# Patient Record
Sex: Male | Born: 1950
Health system: Southern US, Community
[De-identification: ages and names within clinical notes are randomized; demographics above are authoritative.]

## PROBLEM LIST (undated history)

## (undated) DIAGNOSIS — Z5189 Encounter for other specified aftercare: Secondary | ICD-10-CM

## (undated) DIAGNOSIS — N390 Urinary tract infection, site not specified: Secondary | ICD-10-CM

## (undated) DIAGNOSIS — I10 Essential (primary) hypertension: Secondary | ICD-10-CM

## (undated) DIAGNOSIS — M1990 Unspecified osteoarthritis, unspecified site: Secondary | ICD-10-CM

## (undated) DIAGNOSIS — E785 Hyperlipidemia, unspecified: Secondary | ICD-10-CM

## (undated) HISTORY — DX: Unspecified osteoarthritis, unspecified site: M19.90

## (undated) HISTORY — DX: Encounter for other specified aftercare: Z51.89

## (undated) HISTORY — DX: Urinary tract infection, site not specified: N39.0

## (undated) HISTORY — DX: Essential (primary) hypertension: I10

## (undated) HISTORY — PX: OTHER SURGICAL HISTORY: SHX169

## (undated) HISTORY — PX: BUNIONECTOMY: SHX129

## (undated) HISTORY — DX: Hyperlipidemia, unspecified: E78.5

## (undated) HISTORY — PX: COLONOSCOPY: SHX174

---

## 2003-03-03 ENCOUNTER — Encounter: Admission: RE | Admit: 2003-03-03 | Discharge: 2003-03-03 | Payer: Self-pay | Admitting: Family Medicine

## 2006-06-12 DIAGNOSIS — K219 Gastro-esophageal reflux disease without esophagitis: Secondary | ICD-10-CM | POA: Insufficient documentation

## 2007-06-18 ENCOUNTER — Ambulatory Visit: Payer: Self-pay | Admitting: Family Medicine

## 2007-06-19 ENCOUNTER — Encounter: Payer: Self-pay | Admitting: Family Medicine

## 2007-06-19 DIAGNOSIS — E782 Mixed hyperlipidemia: Secondary | ICD-10-CM

## 2007-06-19 LAB — CONVERTED CEMR LAB
Cholesterol: 251 mg/dL — ABNORMAL HIGH (ref 0–200)
LDL Cholesterol: 163 mg/dL — ABNORMAL HIGH (ref 0–99)
Triglycerides: 169 mg/dL — ABNORMAL HIGH (ref <150)
VLDL: 34 mg/dL (ref 0–40)

## 2007-06-25 ENCOUNTER — Telehealth (INDEPENDENT_AMBULATORY_CARE_PROVIDER_SITE_OTHER): Payer: Self-pay | Admitting: *Deleted

## 2009-08-31 ENCOUNTER — Ambulatory Visit: Payer: Self-pay | Admitting: Sports Medicine

## 2009-08-31 DIAGNOSIS — M25559 Pain in unspecified hip: Secondary | ICD-10-CM

## 2009-08-31 DIAGNOSIS — M2020 Hallux rigidus, unspecified foot: Secondary | ICD-10-CM

## 2009-08-31 DIAGNOSIS — M25529 Pain in unspecified elbow: Secondary | ICD-10-CM

## 2010-05-15 NOTE — Assessment & Plan Note (Signed)
Summary: NP,HIP,FINGER/TOE PAIN,[place],MC   Vital Signs:  Patient profile:   60 year old male Height:      70 inches Weight:      170 pounds BMI:     24.48 BP sitting:   132 / 86  Vitals Entered By: Lillia Pauls CMA (Aug 31, 2009 11:24 AM)  History of Present Illness: Left hip hurt 15 yrs ago in fall from horse pretty severe  Rt index finger painful and not straight not dislocated at any time  RT great toe stepped on by horse now will not bend and hurts a good bit  Has bilat medial elbow pain this is worse on left hurts w heavier objects  Allergies (verified): No Known Drug Allergies  Physical Exam  General:  Well-developed,well-nourished,in no acute distress; alert,appropriate and cooperative throughout examination Msk:  both hips are tight but have equal ROM at  ~ 60 deg of rotation very tight on SIJ or x leg tender over hip rotators on left and  troch exc strength  Lt great toe has marked DJD changes only 10 deg ext and 15 deg flex  lt elbow weak on wrist flex slt fing flex some atrophy of med flex compt RT elbow is OK   Impression & Recommendations:  Problem # 1:  HALLUX RIGIDUS (ICD-735.2)  prob DJD  will add sports insoles take pressure off 1st ray  Orders: Sports Insoles ([id number])  Problem # 2:  HIP PAIN, LEFT (ICD-719.45)  His updated medication list for this problem includes:    Diclofenac Sodium 75 Mg Tbec (Diclofenac sodium) .[name]... 1 by mouth bid   tx as soft tissue use NSAIDS see inst  Problem # 3:  ELBOW PAIN, LEFT (ICD-719.42) medial tenniselbow prob f farm work  see rehab use tennis elbow band  reck all in 2 to 3 mos  Complete Medication List: 1)  Diclofenac Sodium 75 Mg Tbec (Diclofenac sodium) .[name].. 1 by mouth bid  Patient Instructions: 1)  wrist curls - down fast / up slow 2)  finger curls - same way 3)  wrist rolls 4)  3 sets 15 reps  5)  for wrist use 3 lbs 6)  for fingers use bands 7)  for toe - try an  insert that takes pressure off inside 8)  left hip  9)  standing hip rotations 10)  set of 10 to 15 11)  xleg stretch 3 x 30 secs 12)  use diclofenac for aching, arthritis sxs as needed up to 2 per day 13)  take with food Prescriptions: DICLOFENAC SODIUM 75 MG TBEC (DICLOFENAC SODIUM) 1 by mouth bid  #60 x 6   Entered and Authorized by:   Enid Baas MD   Signed by:   Enid Baas MD on 08/31/2009   Method used:   Print then Give to Patient   RxID:   [id number]

## 2010-05-17 ENCOUNTER — Encounter: Payer: Self-pay | Admitting: *Deleted

## 2011-01-29 ENCOUNTER — Encounter: Payer: Self-pay | Admitting: *Deleted

## 2011-01-29 NOTE — Patient Instructions (Signed)
APPT WITH DR REGAL ON MON OCT 29TH AT 11AM ARRIVE AT 10:45AM [address] [place] [phone]

## 2013-01-27 ENCOUNTER — Ambulatory Visit: Payer: BC Managed Care – PPO | Attending: Internal Medicine | Admitting: Internal Medicine

## 2013-01-27 ENCOUNTER — Encounter: Payer: Self-pay | Admitting: Internal Medicine

## 2013-01-27 VITALS — BP 150/89 | HR 66 | Temp 98.5°F | Resp 17

## 2013-01-27 DIAGNOSIS — R030 Elevated blood-pressure reading, without diagnosis of hypertension: Secondary | ICD-10-CM | POA: Insufficient documentation

## 2013-01-27 DIAGNOSIS — K219 Gastro-esophageal reflux disease without esophagitis: Secondary | ICD-10-CM

## 2013-01-27 DIAGNOSIS — Z0000 Encounter for general adult medical examination without abnormal findings: Secondary | ICD-10-CM | POA: Insufficient documentation

## 2013-01-27 DIAGNOSIS — Z23 Encounter for immunization: Secondary | ICD-10-CM

## 2013-01-27 DIAGNOSIS — E782 Mixed hyperlipidemia: Secondary | ICD-10-CM

## 2013-01-27 LAB — COMPREHENSIVE METABOLIC PANEL
ALT: 24 U/L (ref 0–53)
Albumin: 4.6 g/dL (ref 3.5–5.2)
CO2: 30 mEq/L (ref 19–32)
Calcium: 9.8 mg/dL (ref 8.4–10.5)
Chloride: 103 mEq/L (ref 96–112)
Glucose, Bld: 76 mg/dL (ref 70–99)
Sodium: 139 mEq/L (ref 135–145)
Total Protein: 7.2 g/dL (ref 6.0–8.3)

## 2013-01-27 LAB — CBC WITH DIFFERENTIAL/PLATELET
Eosinophils Absolute: 0.2 10*3/uL (ref 0.0–0.7)
Hemoglobin: 14.4 g/dL (ref 13.0–17.0)
Lymphocytes Relative: 46 % (ref 12–46)
Lymphs Abs: 2.5 10*3/uL (ref 0.7–4.0)
MCH: 30.6 pg (ref 26.0–34.0)
MCV: 88.5 fL (ref 78.0–100.0)
Monocytes Relative: 12 % (ref 3–12)
Neutrophils Relative %: 38 % — ABNORMAL LOW (ref 43–77)
Platelets: 239 10*3/uL (ref 150–400)
RBC: 4.7 MIL/uL (ref 4.22–5.81)
WBC: 5.3 10*3/uL (ref 4.0–10.5)

## 2013-01-27 LAB — LIPID PANEL
Cholesterol: 263 mg/dL — ABNORMAL HIGH (ref 0–200)
Triglycerides: 374 mg/dL — ABNORMAL HIGH (ref <150)

## 2013-01-27 NOTE — Progress Notes (Signed)
Patient here to establish care and for a physical

## 2013-01-27 NOTE — Progress Notes (Signed)
Patient ID: JAEQUAN PROPES, male   DOB: [birth year], 62 y.o.   MRN: [id number] Patient Demographics  Windle Huebert, is a 62 y.o. male  [id number]  [id number]  DOB - 1951-02-21  Chief Complaint  Patient presents with  . Annual Exam        Subjective:   Rik Wadel today is here to establish primary care. Patient is a 62 year old male with history of hyperlipidemia, not on any medications, presented to establish care. Patient reports that he is doing well with no symptoms at all into skin for physical. His BP was somewhat elevated at 150/89 but states that he is somewhat nervous to be in the clinic. He is otherwise very physically active person, rides mountain bikes. States had colonoscopy and he was 55 and was completely normal, he was recommended next in 10 years  Patient has No headache, No chest pain, No abdominal pain - No Nausea, No new weakness tingling or numbness, No Cough - SOB.   Objective:    Filed Vitals:   01/27/13 1518  BP: 150/89  Pulse: 66  Temp: 98.5 F (36.9 C)  Resp: 17  SpO2: 100%     ALLERGIES:  No Known Allergies  PAST MEDICAL HISTORY: No past medical history on file.  PAST SURGICAL HISTORY: No past surgical history on file.  FAMILY HISTORY: No family history on file.  MEDICATIONS AT HOME: Prior to Admission medications   Medication Sig Start Date End Date Taking? Authorizing Provider  aspirin 81 MG tablet Take 81 mg by mouth daily.   Yes Historical Provider, MD  diclofenac (VOLTAREN) 75 MG EC tablet Take 75 mg by mouth 2 (two) times daily.      Historical Provider, MD    REVIEW OF SYSTEMS:  Constitutional:   No   Fevers, chills, fatigue.  HEENT:    No headaches, Sore throat,   Cardio-vascular: No chest pain,  Orthopnea, swelling in lower extremities, anasarca, palpitations  GI:  No abdominal pain, nausea, vomiting, diarrhea  Resp: No shortness of breath,  No coughing up of blood.No cough.No wheezing.  Skin:  no rash  or lesions.  GU:  no dysuria, change in color of urine, no urgency or frequency.  No flank pain.  Musculoskeletal: No joint pain or swelling.  No decreased range of motion.  No back pain.  Psych: No change in mood or affect. No depression or anxiety.  No memory loss.   Exam  General appearance :Awake, alert, NAD, Speech Clear. HEENT: Atraumatic and Normocephalic, PERLA Neck: supple, no JVD. No cervical lymphadenopathy.  Chest: clear to auscultation bilaterally, no wheezing, rales or rhonchi CVS: S1 S2 regular, no murmurs.  Abdomen: soft, NBS, NT, ND, no gaurding, rigidity or rebound. Extremities: No cyanosis, clubbing, B/L Lower Ext shows no edema,  Neurology: Awake alert, and oriented X 3, CN II-XII intact, Non focal Skin:No Rash or lesions Wounds: N/A    Data Review   Basic Metabolic Panel: No results found for this basename: NA, K, CL, CO2, GLUCOSE, BUN, CREATININE, CALCIUM, MG, PHOS,  in the last 168 hours Liver Function Tests: No results found for this basename: AST, ALT, ALKPHOS, BILITOT, PROT, ALBUMIN,  in the last 168 hours  CBC: No results found for this basename: WBC, NEUTROABS, HGB, HCT, MCV, PLT,  in the last 168 hours ------------------------------------------------------------------------------------------------------------------ No results found for this basename: HGBA1C,  in the last 72 hours ------------------------------------------------------------------------------------------------------------------ No results found for this basename: CHOL, HDL, LDLCALC, TRIG, CHOLHDL, LDLDIRECT,  in the last  72 hours ------------------------------------------------------------------------------------------------------------------ No results found for this basename: TSH, T4TOTAL, FREET3, T3FREE, THYROIDAB,  in the last 72 hours ------------------------------------------------------------------------------------------------------------------ No results found for this  basename: VITAMINB12, FOLATE, FERRITIN, TIBC, IRON, RETICCTPCT,  in the last 72 hours  Coagulation profile  No results found for this basename: INR, PROTIME,  in the last 168 hours    Assessment & Plan   Active Problems: Elevated BP; - We'll recheck it again at the next visit, patient states that he is bit nervous to be in the doctor's office, however if it is high next time, he is open for an antihypertensive.  History of hyperlipidemia: Not on any medications, check lipid panel today  Routine health screening - States he does not need any ophthalmology referral - Labs, CBC, CMET, lipid panel - Colonoscopy was done at age of 2, was normal per patient - Flu shot today  Recommendations: Follow back in 2 weeks for the labs and a repeat BP check  Follow-up in 2 weeks   Semaj Kham M.D. 01/27/2013, 3:29 PM

## 2013-02-02 NOTE — Progress Notes (Signed)
Quick Note:  Please let the patient know that his Chol, Triglycerides, LDL are high. Please call in scripts for Lipitor 20mg  daily at bed time (#30, 3 refills) AND Lopid 600mg  BID (#60, 3 refills). Thanks. He has appt on 10/31. ______

## 2013-02-03 ENCOUNTER — Telehealth: Payer: Self-pay | Admitting: Emergency Medicine

## 2013-02-03 NOTE — Telephone Encounter (Signed)
Message copied by Darlis Loan on Wed Feb 03, 2013  9:47 AM ------      Message from: RAI, RIPUDEEP K      Created: Tue Feb 02, 2013  6:23 PM       Please let the patient know that his Chol, Triglycerides, LDL are high. Please call in scripts for Lipitor 20mg  daily at bed time (#30, 3 refills) AND Lopid 600mg  BID (#60, 3 refills). Thanks. He has appt on 10/31. ------

## 2013-02-03 NOTE — Telephone Encounter (Signed)
Message copied by Darlis Loan on Wed Feb 03, 2013  8:43 AM ------      Message from: RAI, RIPUDEEP K      Created: Tue Feb 02, 2013  6:23 PM       Please let the patient know that his Chol, Triglycerides, LDL are high. Please call in scripts for Lipitor 20mg  daily at bed time (#30, 3 refills) AND Lopid 600mg  BID (#60, 3 refills). Thanks. He has appt on 10/31. ------

## 2013-02-04 ENCOUNTER — Telehealth: Payer: Self-pay | Admitting: Emergency Medicine

## 2013-02-04 MED ORDER — GEMFIBROZIL 600 MG PO TABS: 600.0000 mg | ORAL_TABLET | Freq: Two times a day (BID) | ORAL | Status: DC

## 2013-02-04 MED ORDER — ATORVASTATIN CALCIUM 10 MG PO TABS: 10.0000 mg | ORAL_TABLET | Freq: Every day | ORAL | Status: DC

## 2013-02-04 NOTE — Progress Notes (Signed)
2nd attempt to reach pt. Informed pt scripts were sent to [facility] pharmacy

## 2013-02-12 ENCOUNTER — Ambulatory Visit: Payer: BC Managed Care – PPO | Attending: Internal Medicine | Admitting: Internal Medicine

## 2013-02-12 VITALS — BP 162/92 | HR 70 | Temp 98.2°F | Resp 16 | Wt 177.4 lb

## 2013-02-12 DIAGNOSIS — E782 Mixed hyperlipidemia: Secondary | ICD-10-CM

## 2013-02-12 DIAGNOSIS — E785 Hyperlipidemia, unspecified: Secondary | ICD-10-CM | POA: Insufficient documentation

## 2013-02-12 DIAGNOSIS — I10 Essential (primary) hypertension: Secondary | ICD-10-CM | POA: Insufficient documentation

## 2013-02-12 NOTE — Progress Notes (Signed)
Patient here for follow up on his high cholesterol And triglycerides

## 2013-02-12 NOTE — Progress Notes (Signed)
Patient Demographics  Jeffrey Rose, is a 62 y.o. male  [id number]  [id number]  DOB - 01/15/1951  Chief Complaint  Patient presents with  . Follow-up        Subjective:   Jeffrey Rose today is here for a follow up visit. He was recently seen by Dr. Isidoro Donning and was told to followup today to get his blood pressures checked and also to followup on his cholesterol results. I have advised Jeffrey Rose to go on antihypertensive medications and antilipid medications, however at this time he would like to try diet, and exercise along with lifestyle modifications. He understands the risks of not treating hypertension and dyslipidemia. He is fully aware of these risks and is acceptable to him at this time. He has no other complaints.  Patient has No headache, No chest pain, No abdominal pain - No Nausea, No new weakness tingling or numbness, No Cough - SOB.   Objective:    Filed Vitals:   02/12/13 1603  BP: 162/92  Pulse: 70  Temp: 98.2 F (36.8 C)  Resp: 16  Weight: 177 lb 6.4 oz (80.468 kg)  SpO2: 100%     ALLERGIES:  No Known Allergies  PAST MEDICAL HISTORY: History reviewed. No pertinent past medical history.  MEDICATIONS AT HOME: Prior to Admission medications   Medication Sig Start Date End Date Taking? Authorizing Provider  aspirin 81 MG tablet Take 81 mg by mouth daily.    Historical Provider, MD  atorvastatin (LIPITOR) 10 MG tablet Take 1 tablet (10 mg total) by mouth daily. 02/04/13   Ripudeep Jenna Luo, MD  diclofenac (VOLTAREN) 75 MG EC tablet Take 75 mg by mouth 2 (two) times daily.      Historical Provider, MD  gemfibrozil (LOPID) 600 MG tablet Take 1 tablet (600 mg total) by mouth 2 (two) times daily before a meal. 02/04/13   Ripudeep Jenna Luo, MD     Exam  General appearance :Awake, alert, not in any distress. Speech Clear. Not toxic Looking HEENT: Atraumatic and Normocephalic, pupils equally reactive to light and accomodation Neck: supple, no JVD. No cervical  lymphadenopathy.  Chest:Good air entry bilaterally, no added sounds  CVS: S1 S2 regular, no murmurs.  Abdomen: Bowel sounds present, Non tender and not distended with no gaurding, rigidity or rebound. Extremities: B/L Lower Ext shows no edema, both legs are warm to touch Neurology: Awake alert, and oriented X 3, CN II-XII intact, Non focal Skin:No Rash Wounds:N/A    Data Review   CBC No results found for this basename: WBC, HGB, HCT, PLT, MCV, MCH, MCHC, RDW, NEUTRABS, LYMPHSABS, MONOABS, EOSABS, BASOSABS, BANDABS, BANDSABD,  in the last 168 hours  Chemistries   No results found for this basename: NA, K, CL, CO2, GLUCOSE, BUN, CREATININE, GFRCGP, CALCIUM, MG, AST, ALT, ALKPHOS, BILITOT,  in the last 168 hours ------------------------------------------------------------------------------------------------------------------ No results found for this basename: HGBA1C,  in the last 72 hours ------------------------------------------------------------------------------------------------------------------ No results found for this basename: CHOL, HDL, LDLCALC, TRIG, CHOLHDL, LDLDIRECT,  in the last 72 hours ------------------------------------------------------------------------------------------------------------------ No results found for this basename: TSH, T4TOTAL, FREET3, T3FREE, THYROIDAB,  in the last 72 hours ------------------------------------------------------------------------------------------------------------------ No results found for this basename: VITAMINB12, FOLATE, FERRITIN, TIBC, IRON, RETICCTPCT,  in the last 72 hours  Coagulation profile  No results found for this basename: INR, PROTIME,  in the last 168 hours    Assessment & Plan   Hypertension - Please see above, I have advised patient to go on antihypertensive medications-however at this  time he was to try exercise/diet modification-he understands the risks including target organ damage, cardio vascular  diseases-we will follow him up in the next 3-4 months to see if his blood pressure is still high- he is agreeable to be started on an antihypertensive medication at that point  Dyslipidemia - As above- does not want to get started on antilipid medication- he wants to wait another 3-4 months before starting. He will try diet and exercise  I've asked the patient to make an earlier appointment if he changes his mind.  Followup in 3-4 months    Health Maintenance -Colonoscopy: He claims he had a colonoscopy when he was 52-he would need one the next 2 years -Vaccinations:  -Influenza- received last visit    The patient was given clear instructions to go to ER or return to medical center if symptoms don't improve, worsen or new problems develop. The patient verbalized understanding. The patient was told to call to get lab results if they haven't heard anything in the next week.

## 2016-03-27 ENCOUNTER — Other Ambulatory Visit (INDEPENDENT_AMBULATORY_CARE_PROVIDER_SITE_OTHER): Payer: Medicare Other

## 2016-03-27 ENCOUNTER — Ambulatory Visit (INDEPENDENT_AMBULATORY_CARE_PROVIDER_SITE_OTHER): Payer: Medicare Other | Admitting: Family

## 2016-03-27 ENCOUNTER — Encounter: Payer: Self-pay | Admitting: Family

## 2016-03-27 VITALS — BP 144/82 | HR 66 | Temp 98.4°F | Resp 16 | Ht 70.0 in | Wt 182.8 lb

## 2016-03-27 DIAGNOSIS — I10 Essential (primary) hypertension: Secondary | ICD-10-CM

## 2016-03-27 DIAGNOSIS — Z0000 Encounter for general adult medical examination without abnormal findings: Secondary | ICD-10-CM | POA: Insufficient documentation

## 2016-03-27 DIAGNOSIS — Z23 Encounter for immunization: Secondary | ICD-10-CM | POA: Diagnosis not present

## 2016-03-27 DIAGNOSIS — Z7289 Other problems related to lifestyle: Secondary | ICD-10-CM

## 2016-03-27 DIAGNOSIS — E782 Mixed hyperlipidemia: Secondary | ICD-10-CM

## 2016-03-27 DIAGNOSIS — Z0001 Encounter for general adult medical examination with abnormal findings: Secondary | ICD-10-CM | POA: Insufficient documentation

## 2016-03-27 LAB — LIPID PANEL
CHOL/HDL RATIO: 4
Cholesterol: 265 mg/dL — ABNORMAL HIGH (ref 0–200)
HDL: 61.1 mg/dL (ref >39.00)
LDL CALC: 173 mg/dL — AB (ref 0–99)
NonHDL: 203.63
Triglycerides: 153 mg/dL — ABNORMAL HIGH (ref 0.0–149.0)
VLDL: 30.6 mg/dL (ref 0.0–40.0)

## 2016-03-27 LAB — COMPREHENSIVE METABOLIC PANEL
ALBUMIN: 4.6 g/dL (ref 3.5–5.2)
ALT: 36 U/L (ref 0–53)
AST: 25 U/L (ref 0–37)
Alkaline Phosphatase: 66 U/L (ref 39–117)
BUN: 18 mg/dL (ref 6–23)
CHLORIDE: 104 meq/L (ref 96–112)
CO2: 33 meq/L — AB (ref 19–32)
CREATININE: 0.89 mg/dL (ref 0.40–1.50)
Calcium: 9.7 mg/dL (ref 8.4–10.5)
GFR: 91.18 mL/min (ref >60.00)
Glucose, Bld: 109 mg/dL — ABNORMAL HIGH (ref 70–99)
Potassium: 4.6 mEq/L (ref 3.5–5.1)
SODIUM: 143 meq/L (ref 135–145)
Total Bilirubin: 1.4 mg/dL — ABNORMAL HIGH (ref 0.2–1.2)
Total Protein: 7.8 g/dL (ref 6.0–8.3)

## 2016-03-27 LAB — CBC
HCT: 46.5 % (ref 39.0–52.0)
Hemoglobin: 15.7 g/dL (ref 13.0–17.0)
MCHC: 33.8 g/dL (ref 30.0–36.0)
MCV: 91.5 fl (ref 78.0–100.0)
Platelets: 207 10*3/uL (ref 150.0–400.0)
RBC: 5.08 Mil/uL (ref 4.22–5.81)
RDW: 12.5 % (ref 11.5–15.5)
WBC: 5 10*3/uL (ref 4.0–10.5)

## 2016-03-27 NOTE — Progress Notes (Signed)
Subjective:    Patient ID: Jeffrey Rose, male    DOB: [birth year], 65 y.o.   MRN: [id number]  Chief Complaint  Patient presents with  . Establish Care    HPI:  Jeffrey Rose is a 65 y.o. male who presents today for a Medicare Annual Wellness/Physical exam.    1) Health Maintenance -   Diet - Averages about 3 meals per day consisting of a regular diet. Caffeine intake of about 2-3 cups daily.  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 Hepatitis C Screening  01/21/1951  . HIV Screening  04/03/1966  . TETANUS/TDAP  04/03/1970  . COLONOSCOPY  04/03/2001  . ZOSTAVAX  06/13/2016 (Originally 04/04/2011)  . INFLUENZA VACCINE  Completed    Immunization History  Administered Date(s) Administered  . Influenza Split 01/27/2013  . Tdap 03/27/2016    RISK FACTORS  Tobacco History  Smoking Status  . Former Smoker  Smokeless Tobacco  . Former Systems developer    Comment: smoke pipe, quit 20 years ago     Cardiac risk factors: advanced age (older than 63 for men, 79 for women), dyslipidemia, hypertension and male gender.  Depression Screen  Depression screen [facility] 2/9 03/27/2016  Decreased Interest 0  Down, Depressed, Hopeless 0  PHQ - 2 Score 0     Activities of Daily Living In your present state of health, do you have any difficulty performing the following activities?:  Driving? No Managing money?  No Feeding yourself? No Getting from bed to chair? No Climbing a flight of stairs? No Preparing food and eating?: No Bathing or showering? No Getting dressed: No Getting to the toilet? No Using the toilet: No Moving around from place to place: No In the past year have you fallen or had a near fall?:No   Home Safety Has smoke detector and wears seat belts. No firearms. No excess sun exposure. Are there smokers in your  home (other than you)?  No Do you feel safe at home?  Yes  Hearing Difficulties: No Do you often ask people to speak up or repeat themselves? No Do you experience ringing or noises in your ears? No  Do you have difficulty understanding soft or whispered voices? No    Cognitive Testing  Alert? Yes   Normal Appearance? Yes  Oriented to person? Yes  Place? Yes   Time? Yes  Recall of three objects?  Yes  Can perform simple calculations? Yes  Displays appropriate judgment? Yes  Can read the correct time from a watch face? Yes  Do you feel that you have a problem with memory? No  Do you often misplace items? No   Advanced Directives have been discussed with the patient? Yes   Current Physicians/Providers and Suppliers  1. Terri Piedra, FNP - Internal Medicine 2. Lynne Logan, MD - Urology  Indicate any recent Medical Services you may have received from other than [facility] providers in the past year (date may be approximate).  All answers were reviewed with the patient and necessary referrals were made:  Mauricio Po, [place]   03/27/2016    No Known Allergies   Outpatient Medications Prior to Visit  Medication Sig Dispense Refill  . aspirin 81 MG tablet Take 81 mg by mouth daily.    [name] atorvastatin (LIPITOR) 10 MG tablet Take  1 tablet (10 mg total) by mouth daily. 30 tablet 3  . diclofenac (VOLTAREN) 75 MG EC tablet Take 75 mg by mouth 2 (two) times daily.      [name] gemfibrozil (LOPID) 600 MG tablet Take 1 tablet (600 mg total) by mouth 2 (two) times daily before a meal. 60 tablet 3   No facility-administered medications prior to visit.      Past Medical History:  Diagnosis Date  . Arthritis   . Hyperlipidemia   . Hypertension   . Urinary tract infection      History reviewed. No pertinent surgical history.   Family History  Problem Relation Age of Onset  . Kidney disease Mother   . Diabetes Mother   . Emphysema Father   . Heart disease Father      Social  History   Social History  . Marital status: Married    Spouse name: N/A  . Number of children: 2  . Years of education: 16   Occupational History  . [employer]    Social History Main Topics  . Smoking status: Former Research scientist (life sciences)  . Smokeless tobacco: Former Systems developer     Comment: smoke pipe, quit 20 years ago  . Alcohol use 1.2 - 1.8 oz/week    2 - 3 Glasses of wine per week     Comment: occasionally   . Drug use: No  . Sexual activity: Not on file   Other Topics Concern  . Not on file   Social History Narrative   Fun: [employer] b[employer]     Review of Systems  Constitutional: Denies fever, chills, fatigue, or significant weight gain/loss. HENT: Head: Denies headache or neck pain Ears: Denies changes in hearing, ringing in ears, earache, drainage Nose: Denies discharge, stuffiness, itching, nosebleed, sinus pain Throat: Denies sore throat, hoarseness, dry mouth, sores, thrush Eyes: Denies loss/changes in vision, pain, redness, blurry/double vision, flashing lights Cardiovascular: Denies chest pain/discomfort, tightness, palpitations, shortness of breath with activity, difficulty lying down, swelling, sudden awakening with shortness of breath Respiratory: Denies shortness of breath, cough, sputum production, wheezing Gastrointestinal: Denies dysphasia, heartburn, change in appetite, nausea, change in bowel habits, rectal bleeding, constipation, diarrhea, yellow skin or eyes Genitourinary: Denies frequency, urgency, burning/pain, blood in urine, incontinence, change in urinary strength. Musculoskeletal: Denies muscle/joint pain, stiffness, back pain, redness or swelling of joints, trauma Skin: Denies rashes, lumps, itching, dryness, color changes, or hair/nail changes Neurological: Denies dizziness, fainting, seizures, weakness, numbness, tingling, tremor Psychiatric - Denies nervousness, stress, depression or memory loss Endocrine: Denies heat or cold intolerance, sweating, frequent  urination, excessive thirst, changes in appetite Hematologic: Denies ease of bruising or bleeding    Objective:     BP (!) 144/82 (BP Location: Left Arm, Patient Position: Sitting, Cuff Size: Normal)   Pulse 66   Temp 98.4 F (36.9 C) (Oral)   Resp 16   Ht 5\' 10"  (1.778 m)   Wt 182 lb 12.8 oz (82.9 kg)   SpO2 96%   BMI 26.23 kg/m  Nursing note and vital signs reviewed.   Visual Acuity Screening   Right eye Left eye Both eyes  Without correction: 20/30 20/40 20/30   With correction:       Physical Exam  Constitutional: He is oriented to person, place, and time. He appears well-developed and well-nourished.  HENT:  Head: Normocephalic.  Right Ear: Hearing, tympanic membrane, external ear and ear canal normal.  Left Ear: Hearing, tympanic membrane, external ear and ear canal normal.  Nose:  Nose normal.  Mouth/Throat: Uvula is midline, oropharynx is clear and moist and mucous membranes are normal.  Eyes: Conjunctivae and EOM are normal. Pupils are equal, round, and reactive to light.  Neck: Neck supple. No JVD present. No tracheal deviation present. No thyromegaly present.  Cardiovascular: Normal rate, regular rhythm, normal heart sounds and intact distal pulses.   Pulmonary/Chest: Effort normal and breath sounds normal.  Abdominal: Soft. Bowel sounds are normal. He exhibits no distension and no mass. There is no tenderness. There is no rebound and no guarding.  Musculoskeletal: Normal range of motion. He exhibits no edema or tenderness.  Lymphadenopathy:    He has no cervical adenopathy.  Neurological: He is alert and oriented to person, place, and time. He has normal reflexes. No cranial nerve deficit. He exhibits normal muscle tone. Coordination normal.  Skin: Skin is warm and dry.  Psychiatric: He has a normal mood and affect. His behavior is normal. Judgment and thought content normal.       Assessment & Plan:   During the course of the visit the patient was educated  and counseled about appropriate screening and preventive services including:    Pneumococcal vaccine   Influenza vaccine  Td vaccine  Prostate cancer screening  Colorectal cancer screening  Nutrition counseling   Diet review for nutrition referral? Yes ____  Not Indicated _X___   Patient Instructions (the written plan) was given to the patient.  Medicare Attestation I have personally reviewed: The patient's medical and social history Their use of alcohol, tobacco or illicit drugs Their current medications and supplements The patient's functional ability including ADLs,fall risks, home safety risks, cognitive, and hearing and visual impairment Diet and physical activities Evidence for depression or mood disorders  The patient's weight, height, BMI,  have been recorded in the chart.  I have made referrals, counseling, and provided education to the patient based on review of the above and I have provided the patient with a written personalized care plan for preventive services.     Problem List Items Addressed This Visit      Cardiovascular and Mediastinum   Essential hypertension    Not currently maintained on medication. Blood pressure is slightly elevated. Denies worst headache of life with no symptoms of end organ damaged noted upon exam. Encouraged to monitor blood pressure daily and follow low sodium diet.         Other   MIXED HYPERLIPIDEMIA    Previous lipid profile elevated and not currently maintained on medication. Lipid profile updated today. Recommend decreasing saturated fats and processed/sugary foods. Follow up pending lipid profile results.       Medicare annual wellness visit, initial - Primary    Reviewed and updated patient's medical, surgical, family and social history. Medications and allergies were also reviewed. Basic screenings for depression, activities of daily living, hearing, cognition and safety were performed. Provider list was updated and  health plan was provided to the patient.       Routine general medical examination at a health care facility    1) Anticipatory Guidance: Discussed importance of wearing a seatbelt while driving and not texting while driving; changing batteries in smoke detector at least once annually; wearing suntan lotion when outside; eating a balanced and moderate diet; getting physical activity at least 30 minutes per day.  2) Immunizations / Screenings / Labs:  Tetanus updated today. Hold Shingles pending Shingrix and checking insurance for Zostavax. All other immunizations are up to date per recommendations. Due  for a vision exam encouraged to be completed indepedently. Obtain Hepatitis C antibody for hepatitis C screening. PSA covered by Urology. Due for colonoscopy with referral to GI placed. All other screenings are up to date per recommendations. Obtain CBC, CMET, and lipid profile.    Overall well exam with risk factors for cardiovascular disease including hyperlipidemia. Status updated with Lipid profile today. 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 Continue healthy lifestyle behaviors and choices. Follow up prevention exam in 1 year, and follow up office visit pending blood work and for chronic conditions.      Relevant Orders   CBC (Completed)   Comprehensive metabolic panel (Completed)   Lipid panel (Completed)   Ambulatory referral to Gastroenterology    Other Visit Diagnoses    Other problems related to lifestyle       Relevant Orders   Hepatitis C antibody   Need for Tdap vaccination       Relevant Orders   Tdap vaccine greater than or equal to 7yo IM (Completed)       I have discontinued Mr. Sedgwick's diclofenac, aspirin, atorvastatin, and gemfibrozil.  Follow-up: Return in about 3 months (around 06/25/2016), or if symptoms worsen or fail to improve.   Mauricio Po, FNP

## 2016-03-27 NOTE — Assessment & Plan Note (Signed)
Not currently maintained on medication. Blood pressure is slightly elevated. Denies worst headache of life with no symptoms of end organ damaged noted upon exam. Encouraged to monitor blood pressure daily and follow low sodium diet.

## 2016-03-27 NOTE — Assessment & Plan Note (Signed)
Reviewed and updated patient's medical, surgical, family and social history. Medications and allergies were also reviewed. Basic screenings for depression, activities of daily living, hearing, cognition and safety were performed. Provider list was updated and health plan was provided to the patient.  

## 2016-03-27 NOTE — Patient Instructions (Addendum)
Thank you for choosing [facility].  SUMMARY AND INSTRUCTIONS:  Dr. Pricilla Holm.   Labs:  Please stop by the lab on the lower level of the building for your blood work. Your results will be released to [place] (or called to you) after review, usually within 72 hours after test completion. If any changes need to be made, you will be notified at that same time.  1.) The lab is open from 7:30am to 5:30 pm Monday-Friday 2.) No appointment is necessary 3.) Fasting (if needed) is 6-8 hours after food and drink; black coffee and water are okay    Referrals:  They will call to schedule your appointment with gastroenterology for your colonoscopy.   Referrals have been made during this visit. You should expect to hear back from our schedulers in about 7-10 days in regards to establishing an appointment with the specialists we discussed.   Follow up:  If your symptoms worsen or fail to improve, please contact our office for further instruction, or in case of emergency go directly to the emergency room at the closest medical facility.    Health Maintenance  Topic Date Due  . Hepatitis C Screening  12-01-1950  . HIV Screening  04/03/1966  . TETANUS/TDAP  04/03/1970  . COLONOSCOPY  04/03/2001  . ZOSTAVAX  06/13/2016 (Originally 04/04/2011)  . INFLUENZA VACCINE  Completed   Health Maintenance, Male A healthy lifestyle and preventative care can promote health and wellness.  Maintain regular health, dental, and eye exams.  Eat a healthy diet. Foods like vegetables, fruits, whole grains, low-fat dairy products, and lean protein foods contain the nutrients you need and are low in calories. Decrease your intake of foods high in solid fats, added sugars, and salt. Get information about a proper diet from your health care provider, if necessary.  Regular physical exercise is one of the most important things you can do for your health. Most adults should get at least 150 minutes of  moderate-intensity exercise (any activity that increases your heart rate and causes you to sweat) each week. In addition, most adults need muscle-strengthening exercises on 2 or more days a week.   Maintain a healthy weight. The body mass index (BMI) is a screening tool to identify possible weight problems. It provides an estimate of body fat based on height and weight. Your health care provider can find your BMI and can help you achieve or maintain a healthy weight. For males 20 years and older:  A BMI below 18.5 is considered underweight.  A BMI of 18.5 to 24.9 is normal.  A BMI of 25 to 29.9 is considered overweight.  A BMI of 30 and above is considered obese.  Maintain normal blood lipids and cholesterol by exercising and minimizing your intake of saturated fat. Eat a balanced diet with plenty of fruits and vegetables. Blood tests for lipids and cholesterol should begin at age 54 and be repeated every 5 years. If your lipid or cholesterol levels are high, you are over age 104, or you are at high risk for heart disease, you may need your cholesterol levels checked more frequently.Ongoing high lipid and cholesterol levels should be treated with medicines if diet and exercise are not working.  If you smoke, find out from your health care provider how to quit. If you do not use tobacco, do not start.  Lung cancer screening is recommended for adults aged 53-80 years who are at high risk for developing lung cancer because of a history  of smoking. A yearly low-dose CT scan of the lungs is recommended for people who have at least a 30-pack-year history of smoking and are current smokers or have quit within the past 15 years. A pack year of smoking is smoking an average of 1 pack of cigarettes a day for 1 year (for example, a 30-pack-year history of smoking could mean smoking 1 pack a day for 30 years or 2 packs a day for 15 years). Yearly screening should continue until the smoker has stopped smoking  for at least 15 years. Yearly screening should be stopped for people who develop a health problem that would prevent them from having lung cancer treatment.  If you choose to drink alcohol, do not have more than 2 drinks per day. One drink is considered to be 12 oz (360 mL) of beer, 5 oz (150 mL) of wine, or 1.5 oz (45 mL) of liquor.  Avoid the use of street drugs. Do not share needles with anyone. Ask for help if you need support or instructions about stopping the use of drugs.  High blood pressure causes heart disease and increases the risk of stroke. High blood pressure is more likely to develop in:  People who have blood pressure in the end of the normal range (100-139/85-89 mm Hg).  People who are overweight or obese.  People who are African American.  If you are 70-49 years of age, have your blood pressure checked every 3-5 years. If you are 54 years of age or older, have your blood pressure checked every year. You should have your blood pressure measured twice-once when you are at a hospital or clinic, and once when you are not at a hospital or clinic. Record the average of the two measurements. To check your blood pressure when you are not at a hospital or clinic, you can use:  An automated blood pressure machine at a pharmacy.  A home blood pressure monitor.  If you are 41-38 years old, ask your health care provider if you should take aspirin to prevent heart disease.  Diabetes screening involves taking a blood sample to check your fasting blood sugar level. This should be done once every 3 years after age 68 if you are at a normal weight and without risk factors for diabetes. Testing should be considered at a younger age or be carried out more frequently if you are overweight and have at least 1 risk factor for diabetes.  Colorectal cancer can be detected and often prevented. Most routine colorectal cancer screening begins at the age of 63 and continues through age 50. However, your  health care provider may recommend screening at an earlier age if you have risk factors for colon cancer. On a yearly basis, your health care provider may provide home test kits to check for hidden blood in the stool. A small camera at the end of a tube may be used to directly examine the colon (sigmoidoscopy or colonoscopy) to detect the earliest forms of colorectal cancer. Talk to your health care provider about this at age 9 when routine screening begins. A direct exam of the colon should be repeated every 5-10 years through age 56, unless early forms of precancerous polyps or small growths are found.  People who are at an increased risk for hepatitis B should be screened for this virus. You are considered at high risk for hepatitis B if:  You were born in a country where hepatitis B occurs often. Talk with your health  care provider about which countries are considered high risk.  Your parents were born in a high-risk country and you have not received a shot to protect against hepatitis B (hepatitis B vaccine).  You have HIV or AIDS.  You use needles to inject street drugs.  You live with, or have sex with, someone who has hepatitis B.  You are a man who has sex with other men (MSM).  You get hemodialysis treatment.  You take certain medicines for conditions like cancer, organ transplantation, and autoimmune conditions.  Hepatitis C blood testing is recommended for all people born from [birth year] through 1965 and any individual with known risk factors for hepatitis C.  Healthy men should no longer receive prostate-specific antigen (PSA) blood tests as part of routine cancer screening. Talk to your health care provider about prostate cancer screening.  Testicular cancer screening is not recommended for adolescents or adult males who have no symptoms. Screening includes self-exam, a health care provider exam, and other screening tests. Consult with your health care provider about any symptoms you  have or any concerns you have about testicular cancer.  Practice safe sex. Use condoms and avoid high-risk sexual practices to reduce the spread of sexually transmitted infections (STIs).  You should be screened for STIs, including gonorrhea and chlamydia if:  You are sexually active and are younger than 24 years.  You are older than 24 years, and your health care provider tells you that you are at risk for this type of infection.  Your sexual activity has changed since you were last screened, and you are at an increased risk for chlamydia or gonorrhea. Ask your health care provider if you are at risk.  If you are at risk of being infected with HIV, it is recommended that you take a prescription medicine daily to prevent HIV infection. This is called pre-exposure prophylaxis (PrEP). You are considered at risk if:  You are a man who has sex with other men (MSM).  You are a heterosexual man who is sexually active with multiple partners.  You take drugs by injection.  You are sexually active with a partner who has HIV.  Talk with your health care provider about whether you are at high risk of being infected with HIV. If you choose to begin PrEP, you should first be tested for HIV. You should then be tested every 3 months for as long as you are taking PrEP.  Use sunscreen. Apply sunscreen liberally and repeatedly throughout the day. You should seek shade when your shadow is shorter than you. Protect yourself by wearing long sleeves, pants, a wide-brimmed hat, and sunglasses year round whenever you are outdoors.  Tell your health care provider of new moles or changes in moles, especially if there is a change in shape or color. Also, tell your health care provider if a mole is larger than the size of a pencil eraser.  A one-time screening for abdominal aortic aneurysm (AAA) and surgical repair of large AAAs by ultrasound is recommended for men aged 77-75 years who are current or former  smokers.  Stay current with your vaccines (immunizations). This information is not intended to replace advice given to you by your health care provider. Make sure you discuss any questions you have with your health care provider. Document Released: 09/28/2007 Document Revised: 04/22/2014 Document Reviewed: 01/03/2015 Elsevier Interactive Patient Education  2017 [employer].

## 2016-03-27 NOTE — Assessment & Plan Note (Signed)
1) Anticipatory Guidance: Discussed importance of wearing a seatbelt while driving and not texting while driving; changing batteries in smoke detector at least once annually; wearing suntan lotion when outside; eating a balanced and moderate diet; getting physical activity at least 30 minutes per day.  2) Immunizations / Screenings / Labs:  Tetanus updated today. Hold Shingles pending Shingrix and checking insurance for Zostavax. All other immunizations are up to date per recommendations. Due for a vision exam encouraged to be completed indepedently. Obtain Hepatitis C antibody for hepatitis C screening. PSA covered by Urology. Due for colonoscopy with referral to GI placed. All other screenings are up to date per recommendations. Obtain CBC, CMET, and lipid profile.    Overall well exam with risk factors for cardiovascular disease including hyperlipidemia. Status updated with Lipid profile today. 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 Continue healthy lifestyle behaviors and choices. Follow up prevention exam in 1 year, and follow up office visit pending blood work and for chronic conditions.

## 2016-03-27 NOTE — Assessment & Plan Note (Signed)
Previous lipid profile elevated and not currently maintained on medication. Lipid profile updated today. Recommend decreasing saturated fats and processed/sugary foods. Follow up pending lipid profile results.

## 2016-03-28 ENCOUNTER — Encounter: Payer: Self-pay | Admitting: Family

## 2016-03-28 LAB — HEPATITIS C ANTIBODY: HCV AB: NEGATIVE

## 2016-04-01 MED ORDER — PRAVASTATIN SODIUM 20 MG PO TABS: 20.0000 mg | ORAL_TABLET | Freq: Every day | ORAL | 2 refills | Status: DC

## 2016-04-05 DIAGNOSIS — Z23 Encounter for immunization: Secondary | ICD-10-CM | POA: Diagnosis not present

## 2016-04-16 ENCOUNTER — Telehealth: Payer: Self-pay | Admitting: Family

## 2016-04-16 DIAGNOSIS — Z1211 Encounter for screening for malignant neoplasm of colon: Secondary | ICD-10-CM

## 2016-04-16 NOTE — Telephone Encounter (Signed)
Order placed

## 2016-04-16 NOTE — Telephone Encounter (Signed)
Patient is requesting routine colonoscopy referral.

## 2016-04-22 ENCOUNTER — Encounter: Payer: Self-pay | Admitting: Family

## 2016-04-24 ENCOUNTER — Encounter: Payer: Self-pay | Admitting: Gastroenterology

## 2016-05-06 ENCOUNTER — Ambulatory Visit (INDEPENDENT_AMBULATORY_CARE_PROVIDER_SITE_OTHER): Payer: Medicare Other

## 2016-05-06 ENCOUNTER — Ambulatory Visit (INDEPENDENT_AMBULATORY_CARE_PROVIDER_SITE_OTHER): Payer: Medicare Other | Admitting: Podiatry

## 2016-05-06 ENCOUNTER — Encounter: Payer: Self-pay | Admitting: Podiatry

## 2016-05-06 VITALS — HR 71 | Resp 16 | Ht 70.0 in | Wt 180.0 lb

## 2016-05-06 DIAGNOSIS — M2022 Hallux rigidus, left foot: Secondary | ICD-10-CM

## 2016-05-06 DIAGNOSIS — M21611 Bunion of right foot: Secondary | ICD-10-CM

## 2016-05-06 DIAGNOSIS — M21612 Bunion of left foot: Secondary | ICD-10-CM | POA: Diagnosis not present

## 2016-05-06 DIAGNOSIS — L600 Ingrowing nail: Secondary | ICD-10-CM

## 2016-05-06 NOTE — Patient Instructions (Signed)
Pre-Operative Instructions  Congratulations, you have decided to take an important step to improving your quality of life.  You can be assured that the doctors of [facility] will be with you every step of the way.  1. Plan to be at the surgery center/hospital at least 1 (one) hour prior to your scheduled time unless otherwise directed by the surgical center/hospital staff.  You must have a responsible adult accompany you, remain during the surgery and drive you home.  Make sure you have directions to the surgical center/hospital and know how to get there on time. 2. For hospital based surgery you will need to obtain a history and physical form from your family physician within 1 month prior to the date of surgery- we will give you a form for you primary physician.  3. We make every effort to accommodate the date you request for surgery.  There are however, times where surgery dates or times have to be moved.  We will contact you as soon as possible if a change in schedule is required.   4. No Aspirin/Ibuprofen for one week before surgery.  If you are on aspirin, any non-steroidal anti-inflammatory medications (Mobic, Aleve, Ibuprofen) you should stop taking it 7 days prior to your surgery.  You make take Tylenol  For pain prior to surgery.  5. Medications- If you are taking daily heart and blood pressure medications, seizure, reflux, allergy, asthma, anxiety, pain or diabetes medications, make sure the surgery center/hospital is aware before the day of surgery so they may notify you which medications to take or avoid the day of surgery. 6. No food or drink after midnight the night before surgery unless directed otherwise by surgical center/hospital staff. 7. No alcoholic beverages 24 hours prior to surgery.  No smoking 24 hours prior to or 24 hours after surgery. 8. Wear loose pants or shorts- loose enough to fit over bandages, boots, and casts. 9. No slip on shoes, sneakers are best. 10. Bring  your boot with you to the surgery center/hospital.  Also bring crutches or a walker if your physician has prescribed it for you.  If you do not have this equipment, it will be provided for you after surgery. 11. If you have not been contracted by the surgery center/hospital by the day before your surgery, call to confirm the date and time of your surgery. 12. Leave-time from work may vary depending on the type of surgery you have.  Appropriate arrangements should be made prior to surgery with your employer. 13. Prescriptions will be provided immediately following surgery by your doctor.  Have these filled as soon as possible after surgery and take the medication as directed. 14. Remove nail polish on the operative foot. 15. Wash the night before surgery.  The night before surgery wash the foot and leg well with the antibacterial soap provided and water paying special attention to beneath the toenails and in between the toes.  Rinse thoroughly with water and dry well with a towel.  Perform this wash unless told not to do so by your physician.  Enclosed: 1 Ice pack (please put in freezer the night before surgery)   1 Hibiclens skin cleaner   Pre-op Instructions  If you have any questions regarding the instructions, do not hesitate to call our office.  [place]: [address] [place], [place] [address] [phone]  [place]: [address] [place], [place] [address] [phone]  [place]: [address]  [place], [place] [address] [phone]   Dr.   Shandon Burlingame DPM, Dr. Matthew Wagoner DPM, Dr. M. Todd Hyatt DPM, Dr. Titorya Stover DPM 

## 2016-05-07 NOTE — Progress Notes (Signed)
Subjective:     Patient ID: Jeffrey Rose, male   DOB: November 02, 1950, 66 y.o.   MRN: [id number]  HPI patient states that he knows he needs to get the big toe joint fixed on his left foot like his right one and it started to become increasingly painful over the last 6 months and he's been trying wider shoes and other treatment options. Also states his big toenail right lateral sides been sore as the second toe presses on   Review of Systems  All other systems reviewed and are negative.      Objective:   Physical Exam  Constitutional: He is oriented to person, place, and time.  Cardiovascular: Intact distal pulses.   Musculoskeletal: Normal range of motion.  Neurological: He is oriented to person, place, and time.  Skin: Skin is warm.  Nursing note and vitals reviewed.  neurovascular status intact muscle strength adequate range of motion within normal limits with patient noted to have exquisite discomfort first MPJ left with narrowing the joint surface spur formation and reduced range of motion of the joint. Patient also has good digital perfusion is well oriented and the right second toe doesn't lean against the big toe with incurvation of the lateral border. Previous surgery on the right big toe joint that's done well     Assessment:     Hallux limitus rigidus condition left with spur formation pain of the joint surface and probable ingrown toenail right with structural changes of the big toe joint and second toe    Plan:     H&P x-rays reviewed conditions discussed at great length. Due to previous comfort with surgery patient wants surgery and wants to do consent form today so he does not have another copayment. I reviewed Austin-type osteotomy left going over alternative treatments and complications that can occur and the fact that ultimately could require joint implantation or fusion procedure. Patient wants surgery signed consent form is given all preoperative instructions and did have  air fracture walker dispensed with all instructions on usage. For the right hallux nail I do not recommend correction but we may do it in the future depending on how it does  X-ray indicates there is large spurring around the first metatarsal head left with narrowing of the joint surface and elevation of the first metatarsal segment

## 2016-05-08 ENCOUNTER — Telehealth: Payer: Self-pay | Admitting: *Deleted

## 2016-05-08 NOTE — Telephone Encounter (Signed)
Entered in error

## 2016-05-09 ENCOUNTER — Telehealth: Payer: Self-pay | Admitting: *Deleted

## 2016-05-09 NOTE — Telephone Encounter (Signed)
"  The request from Dr. Paulla Dolly for cpt [phone] has been authorized.  It is authorized from 05/08/2016 to 06/07/2016.  The authorization number is [id number].

## 2016-05-14 ENCOUNTER — Encounter: Payer: Self-pay | Admitting: Podiatry

## 2016-05-14 DIAGNOSIS — M2012 Hallux valgus (acquired), left foot: Secondary | ICD-10-CM | POA: Diagnosis not present

## 2016-05-14 DIAGNOSIS — K219 Gastro-esophageal reflux disease without esophagitis: Secondary | ICD-10-CM | POA: Diagnosis not present

## 2016-05-14 DIAGNOSIS — M2022 Hallux rigidus, left foot: Secondary | ICD-10-CM | POA: Diagnosis not present

## 2016-05-17 ENCOUNTER — Telehealth: Payer: Self-pay

## 2016-05-17 NOTE — Telephone Encounter (Signed)
LVM regarding post op status 

## 2016-05-21 NOTE — Progress Notes (Signed)
DOS 01.30.2018 Bi-Planar Austin (cutting and moving bone) with Pin Fixation Left

## 2016-05-22 ENCOUNTER — Ambulatory Visit (INDEPENDENT_AMBULATORY_CARE_PROVIDER_SITE_OTHER): Payer: Medicare Other

## 2016-05-22 ENCOUNTER — Ambulatory Visit (INDEPENDENT_AMBULATORY_CARE_PROVIDER_SITE_OTHER): Payer: Self-pay | Admitting: Podiatry

## 2016-05-22 ENCOUNTER — Encounter: Payer: Self-pay | Admitting: Podiatry

## 2016-05-22 VITALS — BP 144/91 | HR 89 | Temp 99.1°F

## 2016-05-22 DIAGNOSIS — M21612 Bunion of left foot: Secondary | ICD-10-CM

## 2016-05-22 DIAGNOSIS — M2022 Hallux rigidus, left foot: Secondary | ICD-10-CM | POA: Diagnosis not present

## 2016-05-22 DIAGNOSIS — M21611 Bunion of right foot: Secondary | ICD-10-CM

## 2016-05-22 DIAGNOSIS — Z9889 Other specified postprocedural states: Secondary | ICD-10-CM

## 2016-05-29 ENCOUNTER — Ambulatory Visit (INDEPENDENT_AMBULATORY_CARE_PROVIDER_SITE_OTHER): Payer: Medicare Other | Admitting: Podiatry

## 2016-05-29 DIAGNOSIS — M21611 Bunion of right foot: Secondary | ICD-10-CM | POA: Diagnosis not present

## 2016-05-29 DIAGNOSIS — M21612 Bunion of left foot: Secondary | ICD-10-CM | POA: Diagnosis not present

## 2016-05-29 DIAGNOSIS — M2022 Hallux rigidus, left foot: Secondary | ICD-10-CM

## 2016-05-29 NOTE — Progress Notes (Signed)
Subjective: Patient presents today status post bunionectomy surgery and hallux limitus correction left foot. Date of surgery 05/14/2016. Surgery performed by Dr. Paulla Dolly. Patient is using Advil for pain as needed.  Objective: Skin incisions are well coapted with sutures intact. No sign of infectious process noted. Moderate edema noted. Negative for drainage.  Assessment: Status post hallux limitus correction left foot. Date of surgery 05/14/2016.  Plan of care: Today dressings were changed. Patient can begin using his Darco shoe at home. Follow-up in 1 week with Dr. Paulla Dolly. Continue Advil as needed for pain.  Edrick Kins, DPM [facility]  Dr. Edrick Kins, [place]                                        [place], [place] [address]                Office [phone]  Fax [phone]

## 2016-05-29 NOTE — Progress Notes (Signed)
Subjective:     Patient ID: Jeffrey Rose, male   DOB: 1950-11-04, 66 y.o.   MRN: [id number]  HPI patient states she's doing well with minimal discomfort of the joint first MPJ left   Review of Systems     Objective:   Physical Exam Neurovascular status intact negative Homans sign was noted with good motion of the first MPJ left with no crepitus of the joint surface    Assessment:     Doing well post osteotomy first metatarsal left with encouragement a range of motion needs to be given to patient    Plan:     At this point recommended range of motion exercises left dispensed surgical shoe compression stocking and did do a bump ankle BioSkin brace to plantarflex the first metatarsal and instructed on stretching of this and the nonweightbearing status. Continue immobilization reappoint 4 weeks or earlier if needed

## 2016-06-13 ENCOUNTER — Ambulatory Visit (AMBULATORY_SURGERY_CENTER): Payer: Self-pay

## 2016-06-13 VITALS — Ht 70.0 in | Wt 186.0 lb

## 2016-06-13 DIAGNOSIS — Z1211 Encounter for screening for malignant neoplasm of colon: Secondary | ICD-10-CM

## 2016-06-13 MED ORDER — SUPREP BOWEL PREP KIT 17.5-3.13-1.6 GM/177ML PO SOLN: 1.0000 | Freq: Once | ORAL | 0 refills | Status: AC

## 2016-06-13 NOTE — Progress Notes (Signed)
Patient denies allergies to eggs or soy. Patient is not on home 02. Patient is not taking diet pills.  Patient denies problems with anesthesia.   Patient declines Emmi.

## 2016-06-14 ENCOUNTER — Encounter: Payer: Self-pay | Admitting: Gastroenterology

## 2016-06-27 ENCOUNTER — Encounter: Payer: Self-pay | Admitting: Gastroenterology

## 2016-06-27 ENCOUNTER — Ambulatory Visit (AMBULATORY_SURGERY_CENTER): Payer: Medicare Other | Admitting: Gastroenterology

## 2016-06-27 VITALS — BP 123/77 | HR 60 | Temp 96.9°F | Resp 12 | Ht 70.0 in | Wt 186.0 lb

## 2016-06-27 DIAGNOSIS — D128 Benign neoplasm of rectum: Secondary | ICD-10-CM

## 2016-06-27 DIAGNOSIS — Z1212 Encounter for screening for malignant neoplasm of rectum: Secondary | ICD-10-CM | POA: Diagnosis not present

## 2016-06-27 DIAGNOSIS — Z1211 Encounter for screening for malignant neoplasm of colon: Secondary | ICD-10-CM

## 2016-06-27 DIAGNOSIS — I10 Essential (primary) hypertension: Secondary | ICD-10-CM | POA: Diagnosis not present

## 2016-06-27 MED ORDER — SODIUM CHLORIDE 0.9 % IV SOLN: 500.0000 mL | INTRAVENOUS | Status: DC

## 2016-06-27 NOTE — Progress Notes (Signed)
To recovery, report to [place], RN, VSS 

## 2016-06-27 NOTE — Op Note (Signed)
[name] Procedure Date: 06/27/2016 10:15 AM MRN: [id number] Endoscopist: [place]. Loletha Carrow , MD Age: 66 Referring MD:  Date of Birth: 1950/04/20 Gender: Male Account #: [id number] Procedure:                Colonoscopy Indications:              Screening for colorectal malignant neoplasm                            (patient reports a normal colonoscopy 10 years ago) Medicines:                Monitored Anesthesia Care Procedure:                Pre-Anesthesia Assessment:                           - Prior to the procedure, a History and Physical                            was performed, and patient medications and                            allergies were reviewed. The patient's tolerance of                            previous anesthesia was also reviewed. The risks                            and benefits of the procedure and the sedation                            options and risks were discussed with the patient.                            All questions were answered, and informed consent                            was obtained. Anticoagulants: The patient has taken                            aspirin. It was decided not to withhold this                            medication prior to the procedure. ASA Grade                            Assessment: II - A patient with mild systemic                            disease. After reviewing the risks and benefits,                            the patient was deemed in satisfactory condition to  undergo the procedure.                           After obtaining informed consent, the colonoscope                            was passed under direct vision. Throughout the                            procedure, the patient's blood pressure, pulse, and                            oxygen saturations were monitored continuously. The                            Colonoscope was introduced through the  anus and                            advanced to the the cecum, identified by                            appendiceal orifice and ileocecal valve. The                            colonoscopy was performed without difficulty. The                            patient tolerated the procedure well. The quality                            of the bowel preparation was good. The ileocecal                            valve, appendiceal orifice, and rectum were                            photographed. Scope In: 10:24:05 AM Scope Out: 10:39:17 AM Scope Withdrawal Time: 0 hours 8 minutes 49 seconds  Total Procedure Duration: 0 hours 15 minutes 12 seconds  Findings:                 The perianal and digital rectal examinations were                            normal.                           A 4 mm polyp was found in the rectum. The polyp was                            sessile. The polyp was removed with a cold snare.                            Resection and retrieval were complete.  The exam was otherwise without abnormality on                            direct and retroflexion views. Complications:            No immediate complications. Estimated Blood Loss:     Estimated blood loss: none. Impression:               - One 4 mm polyp in the rectum, removed with a cold                            snare. Resected and retrieved.                           - The examination was otherwise normal on direct                            and retroflexion views. Recommendation:           - Patient has a contact number available for                            emergencies. The signs and symptoms of potential                            delayed complications were discussed with the                            patient. Return to normal activities tomorrow.                            Written discharge instructions were provided to the                            patient.                           -  Resume previous diet.                           - Continue present medications.                           - Await pathology results.                           - Repeat colonoscopy is recommended for                            surveillance. The colonoscopy date will be                            determined after pathology results from today's                            exam become available for review. Dariela Stoker L. Loletha Carrow, MD 06/27/2016 10:45:54 AM This report has been signed  electronically.

## 2016-06-27 NOTE — Progress Notes (Signed)
Called to room to assist during endoscopic procedure.  Patient ID and intended procedure confirmed with present staff. Received instructions for my participation in the procedure from the performing physician.  

## 2016-06-27 NOTE — Patient Instructions (Signed)
YOU HAD AN ENDOSCOPIC PROCEDURE TODAY AT [facility]:   Refer to the procedure report that was given to you for any specific questions about what was found during the examination.  If the procedure report does not answer your questions, please call your gastroenterologist to clarify.  If you requested that your care partner not be given the details of your procedure findings, then the procedure report has been included in a sealed envelope for you to review at your convenience later.  YOU SHOULD EXPECT: Some feelings of bloating in the abdomen. Passage of more gas than usual.  Walking can help get rid of the air that was put into your GI tract during the procedure and reduce the bloating. If you had a lower endoscopy (such as a colonoscopy or flexible sigmoidoscopy) you may notice spotting of blood in your stool or on the toilet paper. If you underwent a bowel prep for your procedure, you may not have a normal bowel movement for a few days.  Please Note:  You might notice some irritation and congestion in your nose or some drainage.  This is from the oxygen used during your procedure.  There is no need for concern and it should clear up in a day or so.  SYMPTOMS TO REPORT IMMEDIATELY:   Following lower endoscopy (colonoscopy or flexible sigmoidoscopy):  Excessive amounts of blood in the stool  Significant tenderness or worsening of abdominal pains  Swelling of the abdomen that is new, acute  Fever of 100F or higher  For urgent or emergent issues, a gastroenterologist can be reached at any hour by calling [phone].   DIET:  We do recommend a small meal at first, but then you may proceed to your regular diet.  Drink plenty of fluids but you should avoid alcoholic beverages for 24 hours.  ACTIVITY:  You should plan to take it easy for the rest of today and you should NOT DRIVE or use heavy machinery until tomorrow (because of the sedation medicines used during the test).     FOLLOW UP: Our staff will call the number listed on your records the next business day following your procedure to check on you and address any questions or concerns that you may have regarding the information given to you following your procedure. If we do not reach you, we will leave a message.  However, if you are feeling well and you are not experiencing any problems, there is no need to return our call.  We will assume that you have returned to your regular daily activities without incident.  If any biopsies were taken you will be contacted by phone or by letter within the next 1-3 weeks.  Please call us at [phone] if you have not heard about the biopsies in 3 weeks.   SIGNATURES/CONFIDENTIALITY: You and/or your care partner have signed paperwork which will be entered into your electronic medical record.  These signatures attest to the fact that that the information above on your After Visit Summary has been reviewed and is understood.  Full responsibility of the confidentiality of this discharge information lies with you and/or your care-partner.  Await pathology  Please read over handout about polyps  Continue your normal medications

## 2016-06-28 ENCOUNTER — Telehealth: Payer: Self-pay | Admitting: *Deleted

## 2016-06-28 NOTE — Telephone Encounter (Signed)
  Follow up Call-  Call back number 06/27/2016  Post procedure Call Back phone  # [phone]  Permission to leave phone message Yes  Some recent data might be hidden     Patient questions:  Do you have a fever, pain , or abdominal swelling? No. Pain Score  0 *  Have you tolerated food without any problems? Yes.    Have you been able to return to your normal activities? Yes.    Do you have any questions about your discharge instructions: Diet   No. Medications  No. Follow up visit  No.  Do you have questions or concerns about your Care? No.  Actions: * If pain score is 4 or above: No action needed, pain <4.

## 2016-06-30 ENCOUNTER — Encounter: Payer: Self-pay | Admitting: Family

## 2016-07-01 MED ORDER — PRAVASTATIN SODIUM 20 MG PO TABS: 20.0000 mg | ORAL_TABLET | Freq: Every day | ORAL | 5 refills | Status: DC

## 2016-07-02 ENCOUNTER — Encounter: Payer: Self-pay | Admitting: Gastroenterology

## 2016-07-04 ENCOUNTER — Ambulatory Visit: Payer: Medicare Other

## 2016-08-25 DIAGNOSIS — N390 Urinary tract infection, site not specified: Secondary | ICD-10-CM | POA: Diagnosis not present

## 2016-08-25 DIAGNOSIS — M545 Low back pain: Secondary | ICD-10-CM | POA: Diagnosis not present

## 2016-08-30 DIAGNOSIS — N390 Urinary tract infection, site not specified: Secondary | ICD-10-CM | POA: Diagnosis not present

## 2016-10-31 DIAGNOSIS — N5201 Erectile dysfunction due to arterial insufficiency: Secondary | ICD-10-CM | POA: Diagnosis not present

## 2016-10-31 DIAGNOSIS — N3000 Acute cystitis without hematuria: Secondary | ICD-10-CM | POA: Diagnosis not present

## 2016-10-31 DIAGNOSIS — R972 Elevated prostate specific antigen [PSA]: Secondary | ICD-10-CM | POA: Diagnosis not present

## 2017-01-12 ENCOUNTER — Encounter: Payer: Self-pay | Admitting: Family

## 2017-01-13 ENCOUNTER — Other Ambulatory Visit: Payer: Self-pay

## 2017-01-13 MED ORDER — PRAVASTATIN SODIUM 20 MG PO TABS: 20.0000 mg | ORAL_TABLET | Freq: Every day | ORAL | 1 refills | Status: DC

## 2017-01-16 ENCOUNTER — Encounter: Payer: Self-pay | Admitting: Family

## 2017-01-16 ENCOUNTER — Other Ambulatory Visit: Payer: Self-pay

## 2017-01-16 ENCOUNTER — Other Ambulatory Visit: Payer: Self-pay | Admitting: Family

## 2017-01-16 MED ORDER — PRAVASTATIN SODIUM 20 MG PO TABS: 20.0000 mg | ORAL_TABLET | Freq: Every day | ORAL | 1 refills | Status: DC

## 2017-02-19 ENCOUNTER — Encounter: Payer: Self-pay | Admitting: Family

## 2017-03-16 ENCOUNTER — Encounter: Payer: Self-pay | Admitting: Internal Medicine

## 2017-03-17 MED ORDER — PRAVASTATIN SODIUM 20 MG PO TABS: 20.0000 mg | ORAL_TABLET | Freq: Every day | ORAL | 0 refills | Status: DC

## 2017-03-20 ENCOUNTER — Encounter: Payer: Self-pay | Admitting: Internal Medicine

## 2017-03-20 MED ORDER — PRAVASTATIN SODIUM 20 MG PO TABS: 20.0000 mg | ORAL_TABLET | Freq: Every day | ORAL | 0 refills | Status: DC

## 2017-04-25 ENCOUNTER — Ambulatory Visit (INDEPENDENT_AMBULATORY_CARE_PROVIDER_SITE_OTHER): Payer: Medicare Other | Admitting: Internal Medicine

## 2017-04-25 ENCOUNTER — Other Ambulatory Visit (INDEPENDENT_AMBULATORY_CARE_PROVIDER_SITE_OTHER): Payer: Medicare Other

## 2017-04-25 ENCOUNTER — Encounter: Payer: Self-pay | Admitting: Internal Medicine

## 2017-04-25 VITALS — BP 126/82 | HR 72 | Temp 98.2°F | Ht 70.0 in | Wt 187.0 lb

## 2017-04-25 DIAGNOSIS — E782 Mixed hyperlipidemia: Secondary | ICD-10-CM

## 2017-04-25 DIAGNOSIS — Z0000 Encounter for general adult medical examination without abnormal findings: Secondary | ICD-10-CM

## 2017-04-25 DIAGNOSIS — I10 Essential (primary) hypertension: Secondary | ICD-10-CM

## 2017-04-25 DIAGNOSIS — R739 Hyperglycemia, unspecified: Secondary | ICD-10-CM | POA: Insufficient documentation

## 2017-04-25 DIAGNOSIS — Z23 Encounter for immunization: Secondary | ICD-10-CM | POA: Diagnosis not present

## 2017-04-25 LAB — BASIC METABOLIC PANEL
BUN: 18 mg/dL (ref 6–23)
CALCIUM: 9.2 mg/dL (ref 8.4–10.5)
CHLORIDE: 102 meq/L (ref 96–112)
CO2: 30 meq/L (ref 19–32)
Creatinine, Ser: 0.99 mg/dL (ref 0.40–1.50)
GFR: 80.37 mL/min (ref >60.00)
GLUCOSE: 85 mg/dL (ref 70–99)
Potassium: 3.6 mEq/L (ref 3.5–5.1)
SODIUM: 139 meq/L (ref 135–145)

## 2017-04-25 LAB — CBC WITH DIFFERENTIAL/PLATELET
BASOS ABS: 0 10*3/uL (ref 0.0–0.1)
Basophils Relative: 0.5 % (ref 0.0–3.0)
Eosinophils Absolute: 0.3 10*3/uL (ref 0.0–0.7)
Eosinophils Relative: 4.7 % (ref 0.0–5.0)
HEMATOCRIT: 44.7 % (ref 39.0–52.0)
HEMOGLOBIN: 14.8 g/dL (ref 13.0–17.0)
LYMPHS PCT: 43.8 % (ref 12.0–46.0)
Lymphs Abs: 2.5 10*3/uL (ref 0.7–4.0)
MCHC: 33.1 g/dL (ref 30.0–36.0)
MCV: 93.5 fl (ref 78.0–100.0)
Monocytes Absolute: 0.8 10*3/uL (ref 0.1–1.0)
Monocytes Relative: 13.4 % — ABNORMAL HIGH (ref 3.0–12.0)
Neutro Abs: 2.2 10*3/uL (ref 1.4–7.7)
Neutrophils Relative %: 37.6 % — ABNORMAL LOW (ref 43.0–77.0)
PLATELETS: 218 10*3/uL (ref 150.0–400.0)
RBC: 4.79 Mil/uL (ref 4.22–5.81)
RDW: 12.5 % (ref 11.5–15.5)
WBC: 5.8 10*3/uL (ref 4.0–10.5)

## 2017-04-25 LAB — URINALYSIS, ROUTINE W REFLEX MICROSCOPIC
BILIRUBIN URINE: NEGATIVE
HGB URINE DIPSTICK: NEGATIVE
Ketones, ur: NEGATIVE
Leukocytes, UA: NEGATIVE
NITRITE: NEGATIVE
Specific Gravity, Urine: 1.02 (ref 1.000–1.030)
Total Protein, Urine: NEGATIVE
Urine Glucose: NEGATIVE
Urobilinogen, UA: 0.2 (ref 0.0–1.0)
pH: 6.5 (ref 5.0–8.0)

## 2017-04-25 LAB — HEPATIC FUNCTION PANEL
ALT: 49 U/L (ref 0–53)
AST: 32 U/L (ref 0–37)
Albumin: 4.3 g/dL (ref 3.5–5.2)
Alkaline Phosphatase: 54 U/L (ref 39–117)
BILIRUBIN TOTAL: 0.9 mg/dL (ref 0.2–1.2)
Bilirubin, Direct: 0.2 mg/dL (ref 0.0–0.3)
Total Protein: 7.5 g/dL (ref 6.0–8.3)

## 2017-04-25 LAB — LIPID PANEL
CHOL/HDL RATIO: 4
Cholesterol: 258 mg/dL — ABNORMAL HIGH (ref 0–200)
HDL: 57.5 mg/dL (ref >39.00)
NONHDL: 200.93
Triglycerides: 280 mg/dL — ABNORMAL HIGH (ref 0.0–149.0)
VLDL: 56 mg/dL — AB (ref 0.0–40.0)

## 2017-04-25 LAB — LDL CHOLESTEROL, DIRECT: Direct LDL: 152 mg/dL

## 2017-04-25 LAB — HEMOGLOBIN A1C: HEMOGLOBIN A1C: 6 % (ref 4.6–6.5)

## 2017-04-25 LAB — TSH: TSH: 2.1 u[IU]/mL (ref 0.35–4.50)

## 2017-04-25 MED ORDER — PRAVASTATIN SODIUM 20 MG PO TABS: 20.0000 mg | ORAL_TABLET | Freq: Every day | ORAL | 3 refills | Status: DC

## 2017-04-25 MED ORDER — ASPIRIN 81 MG PO TABS: 81.0000 mg | ORAL_TABLET | Freq: Every day | ORAL | 3 refills | Status: AC

## 2017-04-25 NOTE — Patient Instructions (Addendum)
You had the Pneumovax shot today  We can try to plan for the Prevnar 13 next year  Please continue all other medications as before, and refills have been done if requested.  Please have the pharmacy call with any other refills you may need.  Please continue your efforts at being more active, low cholesterol diet, and weight control.  You are otherwise up to date with prevention measures today.  Please keep your appointments with your specialists as you may have planned  Please go to the LAB in the Basement (turn left off the elevator) for the tests to be done today  You will be contacted by phone if any changes need to be made immediately.  Otherwise, you will receive a letter about your results with an explanation, but please check with MyChart first.  Please remember to sign up for MyChart if you have not done so, as this will be important to you in the future with finding out test results, communicating by private email, and scheduling acute appointments online when needed.  Please return in 1 year for your yearly visit, or sooner if needed, with Lab testing done 3-5 days before

## 2017-04-25 NOTE — Progress Notes (Signed)
Subjective:    Patient ID: Jeffrey Rose, male    DOB: Oct 19, 1950, 67 y.o.   MRN: [id number]  HPI  Here for wellness and f/u;  Overall doing ok;  Pt denies Chest pain, worsening SOB, DOE, wheezing, orthopnea, PND, worsening LE edema, palpitations, dizziness or syncope.  Pt denies neurological change such as new headache, facial or extremity weakness.  Pt denies polydipsia, polyuria, or low sugar symptoms. Pt states overall good compliance with treatment and medications, good tolerability, and has been trying to follow appropriate diet.  Pt denies worsening depressive symptoms, suicidal ideation or panic. No fever, night sweats, wt loss, loss of appetite, or other constitutional symptoms.  Pt states good ability with ADL's, has low fall risk, home safety reviewed and adequate, no other significant changes in hearing or vision, and only occasionally active with exercise. Work  - Hydrologist. Declines immunizations  Normally has PSA yearly in f/u with urology Dr Tammi Klippel Past Medical History:  Diagnosis Date  . Arthritis   . Hyperlipidemia   . Hypertension   . Urinary tract infection    Past Surgical History:  Procedure Laterality Date  . BUNIONECTOMY Bilateral   . oral growth removed     oral growth removed from mouth.    reports that he has quit smoking. he has never used smokeless tobacco. He reports that he drinks about 1.2 - 1.8 oz of alcohol per week. He reports that he does not use drugs. family history includes Diabetes in his mother; Emphysema in his father; Heart disease in his father; Kidney disease in his mother. No Known Allergies No current outpatient medications on file prior to visit.   No current facility-administered medications on file prior to visit.    Review of Systems Constitutional: Negative for other unusual diaphoresis, sweats, appetite or weight changes HENT: Negative for other worsening hearing loss, ear pain, facial swelling, mouth sores or neck  stiffness.   Eyes: Negative for other worsening pain, redness or other visual disturbance.  Respiratory: Negative for other stridor or swelling Cardiovascular: Negative for other palpitations or other chest pain  Gastrointestinal: Negative for worsening diarrhea or loose stools, blood in stool, distention or other pain Genitourinary: Negative for hematuria, flank pain or other change in urine volume.  Musculoskeletal: Negative for myalgias or other joint swelling.  Skin: Negative for other color change, or other wound or worsening drainage.  Neurological: Negative for other syncope or numbness. Hematological: Negative for other adenopathy or swelling Psychiatric/Behavioral: Negative for hallucinations, other worsening agitation, SI, self-injury, or new decreased concentration All other system neg per pt    Objective:   Physical Exam BP 126/82   Pulse 72   Temp 98.2 F (36.8 C) (Oral)   Ht 5\' 10"  (1.778 m)   Wt 187 lb (84.8 kg)   SpO2 98%   BMI 26.83 kg/m  VS noted,  Constitutional: Pt is oriented to person, place, and time. Appears well-developed and well-nourished, in no significant distress and comfortable Head: Normocephalic and atraumatic  Eyes: Conjunctivae and EOM are normal. Pupils are equal, round, and reactive to light Right Ear: External ear normal without discharge Left Ear: External ear normal without discharge Nose: Nose without discharge or deformity Mouth/Throat: Oropharynx is without other ulcerations and moist  Neck: Normal range of motion. Neck supple. No JVD present. No tracheal deviation present or significant neck LA or mass Cardiovascular: Normal rate, regular rhythm, normal heart sounds and intact distal pulses.   Pulmonary/Chest: WOB normal and  breath sounds without rales or wheezing  Abdominal: Soft. Bowel sounds are normal. NT. No HSM  Musculoskeletal: Normal range of motion. Exhibits no edema Lymphadenopathy: Has no other cervical adenopathy.    Neurological: Pt is alert and oriented to person, place, and time. Pt has normal reflexes. No cranial nerve deficit. Motor grossly intact, Gait intact Skin: Skin is warm and dry. No rash noted or new ulcerations Psychiatric:  Has normal mood and affect. Behavior is normal without agitation No other exam findings    Assessment & Plan:

## 2017-04-26 NOTE — Assessment & Plan Note (Signed)
For f/u lipids with labs, goal LDL < 100

## 2017-04-26 NOTE — Assessment & Plan Note (Signed)
stable overall by history and exam, recent data reviewed with pt, and pt to continue medical treatment as before,  to f/u any worsening symptoms or concerns BP Readings from Last 3 Encounters:  04/25/17 126/82  06/27/16 123/77  05/22/16 (!) 144/91

## 2017-04-26 NOTE — Assessment & Plan Note (Signed)
stable overall by history and exam, recent data reviewed with pt, and pt to continue medical treatment as before,  to f/u any worsening symptoms or concerns Lab Results  Component Value Date   HGBA1C 6.0 04/25/2017   

## 2017-04-26 NOTE — Assessment & Plan Note (Signed)

## 2017-04-27 ENCOUNTER — Other Ambulatory Visit: Payer: Self-pay | Admitting: Internal Medicine

## 2017-04-27 MED ORDER — ROSUVASTATIN CALCIUM 20 MG PO TABS: 20.0000 mg | ORAL_TABLET | Freq: Every day | ORAL | 3 refills | Status: DC

## 2017-04-28 ENCOUNTER — Telehealth: Payer: Self-pay

## 2017-04-28 NOTE — Telephone Encounter (Signed)
Pt has viewed results via MyChart  

## 2017-04-28 NOTE — Telephone Encounter (Signed)
-----   Message from Biagio Borg, MD sent at 04/27/2017  1:55 PM EST ----- Left message on MyChart, pt to cont same tx except  The test results show that your current treatment is OK, except the LDL cholesterol is moderately high.  As we mentioned at your visit, we will need to stop the pravastatin, and change to the more effective crestor 20 mg daily  I will send a new prescription, and you should hear from the office as well.Redmond Baseman to please inform pt, I will do rx

## 2017-04-29 ENCOUNTER — Encounter: Payer: Self-pay | Admitting: Internal Medicine

## 2017-10-04 DIAGNOSIS — Z79899 Other long term (current) drug therapy: Secondary | ICD-10-CM | POA: Diagnosis not present

## 2017-10-04 DIAGNOSIS — W57XXXA Bitten or stung by nonvenomous insect and other nonvenomous arthropods, initial encounter: Secondary | ICD-10-CM | POA: Diagnosis not present

## 2017-10-04 DIAGNOSIS — M1380 Other specified arthritis, unspecified site: Secondary | ICD-10-CM | POA: Diagnosis not present

## 2017-10-20 ENCOUNTER — Encounter: Payer: Self-pay | Admitting: Internal Medicine

## 2017-10-20 DIAGNOSIS — R945 Abnormal results of liver function studies: Secondary | ICD-10-CM | POA: Diagnosis not present

## 2017-10-20 DIAGNOSIS — R030 Elevated blood-pressure reading, without diagnosis of hypertension: Secondary | ICD-10-CM | POA: Diagnosis not present

## 2017-10-20 DIAGNOSIS — R7989 Other specified abnormal findings of blood chemistry: Secondary | ICD-10-CM

## 2017-10-20 DIAGNOSIS — W57XXXD Bitten or stung by nonvenomous insect and other nonvenomous arthropods, subsequent encounter: Secondary | ICD-10-CM | POA: Diagnosis not present

## 2017-10-21 ENCOUNTER — Other Ambulatory Visit (INDEPENDENT_AMBULATORY_CARE_PROVIDER_SITE_OTHER): Payer: Medicare Other

## 2017-10-21 DIAGNOSIS — R945 Abnormal results of liver function studies: Secondary | ICD-10-CM | POA: Diagnosis not present

## 2017-10-21 DIAGNOSIS — R7989 Other specified abnormal findings of blood chemistry: Secondary | ICD-10-CM

## 2017-10-21 LAB — BASIC METABOLIC PANEL
BUN: 16 mg/dL (ref 6–23)
CO2: 31 mEq/L (ref 19–32)
Calcium: 9.2 mg/dL (ref 8.4–10.5)
Chloride: 102 mEq/L (ref 96–112)
Creatinine, Ser: 0.86 mg/dL (ref 0.40–1.50)
GFR: 94.41 mL/min (ref >60.00)
GLUCOSE: 129 mg/dL — AB (ref 70–99)
POTASSIUM: 3.9 meq/L (ref 3.5–5.1)
SODIUM: 139 meq/L (ref 135–145)

## 2017-10-21 LAB — CBC WITH DIFFERENTIAL/PLATELET
BASOS PCT: 0.8 % (ref 0.0–3.0)
Basophils Absolute: 0 10*3/uL (ref 0.0–0.1)
EOS ABS: 0.2 10*3/uL (ref 0.0–0.7)
EOS PCT: 3 % (ref 0.0–5.0)
HCT: 43.2 % (ref 39.0–52.0)
HEMOGLOBIN: 14.4 g/dL (ref 13.0–17.0)
LYMPHS ABS: 2.2 10*3/uL (ref 0.7–4.0)
Lymphocytes Relative: 41.9 % (ref 12.0–46.0)
MCHC: 33.4 g/dL (ref 30.0–36.0)
MCV: 92.5 fl (ref 78.0–100.0)
MONO ABS: 0.7 10*3/uL (ref 0.1–1.0)
Monocytes Relative: 13 % — ABNORMAL HIGH (ref 3.0–12.0)
NEUTROS PCT: 41.3 % — AB (ref 43.0–77.0)
Neutro Abs: 2.1 10*3/uL (ref 1.4–7.7)
PLATELETS: 201 10*3/uL (ref 150.0–400.0)
RBC: 4.67 Mil/uL (ref 4.22–5.81)
RDW: 12.4 % (ref 11.5–15.5)
WBC: 5.2 10*3/uL (ref 4.0–10.5)

## 2017-10-21 LAB — HEPATIC FUNCTION PANEL
ALK PHOS: 50 U/L (ref 39–117)
ALT: 45 U/L (ref 0–53)
AST: 30 U/L (ref 0–37)
Albumin: 4.2 g/dL (ref 3.5–5.2)
BILIRUBIN TOTAL: 1.6 mg/dL — AB (ref 0.2–1.2)
Bilirubin, Direct: 0.2 mg/dL (ref 0.0–0.3)
Total Protein: 7.1 g/dL (ref 6.0–8.3)

## 2017-10-23 DIAGNOSIS — R972 Elevated prostate specific antigen [PSA]: Secondary | ICD-10-CM | POA: Diagnosis not present

## 2017-10-28 DIAGNOSIS — N5201 Erectile dysfunction due to arterial insufficiency: Secondary | ICD-10-CM | POA: Diagnosis not present

## 2017-10-28 DIAGNOSIS — N3000 Acute cystitis without hematuria: Secondary | ICD-10-CM | POA: Diagnosis not present

## 2017-10-28 DIAGNOSIS — R972 Elevated prostate specific antigen [PSA]: Secondary | ICD-10-CM | POA: Diagnosis not present

## 2018-04-29 ENCOUNTER — Other Ambulatory Visit (INDEPENDENT_AMBULATORY_CARE_PROVIDER_SITE_OTHER): Payer: Medicare Other

## 2018-04-29 ENCOUNTER — Ambulatory Visit (INDEPENDENT_AMBULATORY_CARE_PROVIDER_SITE_OTHER): Payer: Medicare Other | Admitting: Internal Medicine

## 2018-04-29 ENCOUNTER — Encounter: Payer: Self-pay | Admitting: Internal Medicine

## 2018-04-29 VITALS — BP 130/80 | HR 75 | Temp 98.4°F | Ht 70.0 in | Wt 186.0 lb

## 2018-04-29 DIAGNOSIS — R739 Hyperglycemia, unspecified: Secondary | ICD-10-CM

## 2018-04-29 DIAGNOSIS — E782 Mixed hyperlipidemia: Secondary | ICD-10-CM

## 2018-04-29 DIAGNOSIS — Z0000 Encounter for general adult medical examination without abnormal findings: Secondary | ICD-10-CM | POA: Diagnosis not present

## 2018-04-29 DIAGNOSIS — I10 Essential (primary) hypertension: Secondary | ICD-10-CM

## 2018-04-29 LAB — URINALYSIS, ROUTINE W REFLEX MICROSCOPIC
Bilirubin Urine: NEGATIVE
Hgb urine dipstick: NEGATIVE
Ketones, ur: NEGATIVE
Leukocytes, UA: NEGATIVE
Nitrite: NEGATIVE
RBC / HPF: NONE SEEN (ref 0–?)
Specific Gravity, Urine: 1.015 (ref 1.000–1.030)
TOTAL PROTEIN, URINE-UPE24: NEGATIVE
Urine Glucose: NEGATIVE
Urobilinogen, UA: 0.2 (ref 0.0–1.0)
pH: 7 (ref 5.0–8.0)

## 2018-04-29 LAB — HEPATIC FUNCTION PANEL
ALBUMIN: 4.4 g/dL (ref 3.5–5.2)
ALT: 65 U/L — ABNORMAL HIGH (ref 0–53)
AST: 44 U/L — ABNORMAL HIGH (ref 0–37)
Alkaline Phosphatase: 51 U/L (ref 39–117)
Bilirubin, Direct: 0.2 mg/dL (ref 0.0–0.3)
Total Bilirubin: 0.9 mg/dL (ref 0.2–1.2)
Total Protein: 7.1 g/dL (ref 6.0–8.3)

## 2018-04-29 LAB — CBC WITH DIFFERENTIAL/PLATELET
Basophils Absolute: 0 10*3/uL (ref 0.0–0.1)
Basophils Relative: 0.8 % (ref 0.0–3.0)
Eosinophils Absolute: 0.3 10*3/uL (ref 0.0–0.7)
Eosinophils Relative: 5.5 % — ABNORMAL HIGH (ref 0.0–5.0)
HCT: 43.5 % (ref 39.0–52.0)
Hemoglobin: 14.6 g/dL (ref 13.0–17.0)
Lymphocytes Relative: 36.8 % (ref 12.0–46.0)
Lymphs Abs: 2.2 10*3/uL (ref 0.7–4.0)
MCHC: 33.6 g/dL (ref 30.0–36.0)
MCV: 92.5 fl (ref 78.0–100.0)
Monocytes Absolute: 0.8 10*3/uL (ref 0.1–1.0)
Monocytes Relative: 13.2 % — ABNORMAL HIGH (ref 3.0–12.0)
Neutro Abs: 2.6 10*3/uL (ref 1.4–7.7)
Neutrophils Relative %: 43.7 % (ref 43.0–77.0)
Platelets: 193 10*3/uL (ref 150.0–400.0)
RBC: 4.7 Mil/uL (ref 4.22–5.81)
RDW: 12.4 % (ref 11.5–15.5)
WBC: 5.9 10*3/uL (ref 4.0–10.5)

## 2018-04-29 LAB — LIPID PANEL
CHOLESTEROL: 201 mg/dL — AB (ref 0–200)
HDL: 64.4 mg/dL (ref >39.00)
NonHDL: 136.87
Total CHOL/HDL Ratio: 3
Triglycerides: 312 mg/dL — ABNORMAL HIGH (ref 0.0–149.0)
VLDL: 62.4 mg/dL — ABNORMAL HIGH (ref 0.0–40.0)

## 2018-04-29 LAB — BASIC METABOLIC PANEL
BUN: 14 mg/dL (ref 6–23)
CO2: 32 mEq/L (ref 19–32)
Calcium: 9.8 mg/dL (ref 8.4–10.5)
Chloride: 103 mEq/L (ref 96–112)
Creatinine, Ser: 0.91 mg/dL (ref 0.40–1.50)
GFR: 88.31 mL/min (ref >60.00)
Glucose, Bld: 78 mg/dL (ref 70–99)
Potassium: 4.5 mEq/L (ref 3.5–5.1)
Sodium: 140 mEq/L (ref 135–145)

## 2018-04-29 LAB — HEMOGLOBIN A1C: Hgb A1c MFr Bld: 6 % (ref 4.6–6.5)

## 2018-04-29 LAB — PSA: PSA: 0.81 ng/mL (ref 0.10–4.00)

## 2018-04-29 LAB — TSH: TSH: 1.98 u[IU]/mL (ref 0.35–4.50)

## 2018-04-29 LAB — LDL CHOLESTEROL, DIRECT: Direct LDL: 100 mg/dL

## 2018-04-29 NOTE — Progress Notes (Signed)
Subjective:    Patient ID: Jeffrey Rose, male    DOB: Sep 08, 1950, 68 y.o.   MRN: [id number]  HPI  Here for wellness and f/u;  Overall doing ok;  Pt denies Chest pain, worsening SOB, DOE, wheezing, orthopnea, PND, worsening LE edema, palpitations, dizziness or syncope.  Pt denies neurological change such as new headache, facial or extremity weakness.  Pt denies polydipsia, polyuria, or low sugar symptoms. Pt states overall good compliance with treatment and medications, good tolerability, and has been trying to follow appropriate diet.  Pt denies worsening depressive symptoms, suicidal ideation or panic. No fever, night sweats, wt loss, loss of appetite, or other constitutional symptoms.  Pt states good ability with ADL's, has low fall risk, home safety reviewed and adequate, no other significant changes in hearing or vision, and only occasionally active with exercise.  Sees urology with mild urinary symptoms, and now s/p bilat bunionectomy.   No new complaints Past Medical History:  Diagnosis Date  . Arthritis   . Hyperlipidemia   . Hypertension   . Urinary tract infection    Past Surgical History:  Procedure Laterality Date  . BUNIONECTOMY Bilateral   . oral growth removed     oral growth removed from mouth.    reports that he has quit smoking. He has never used smokeless tobacco. He reports current alcohol use of about 2.0 - 3.0 standard drinks of alcohol per week. He reports that he does not use drugs. family history includes Diabetes in his mother; Emphysema in his father; Heart disease in his father; Kidney disease in his mother. No Known Allergies Current Outpatient Medications on File Prior to Visit  Medication Sig Dispense Refill  . aspirin 81 MG tablet Take 1 tablet (81 mg total) by mouth daily. 90 tablet 3  . rosuvastatin (CRESTOR) 20 MG tablet Take 1 tablet (20 mg total) by mouth daily. 90 tablet 3   No current facility-administered medications on file prior to visit.     Review of Systems Constitutional: Negative for other unusual diaphoresis, sweats, appetite or weight changes HENT: Negative for other worsening hearing loss, ear pain, facial swelling, mouth sores or neck stiffness.   Eyes: Negative for other worsening pain, redness or other visual disturbance.  Respiratory: Negative for other stridor or swelling Cardiovascular: Negative for other palpitations or other chest pain  Gastrointestinal: Negative for worsening diarrhea or loose stools, blood in stool, distention or other pain Genitourinary: Negative for hematuria, flank pain or other change in urine volume.  Musculoskeletal: Negative for myalgias or other joint swelling.  Skin: Negative for other color change, or other wound or worsening drainage.  Neurological: Negative for other syncope or numbness. Hematological: Negative for other adenopathy or swelling Psychiatric/Behavioral: Negative for hallucinations, other worsening agitation, SI, self-injury, or new decreased concentration All other system neg per pt    Objective:   Physical Exam BP 130/80 (BP Location: Left Arm, Patient Position: Sitting, Cuff Size: Normal)   Pulse 75   Temp 98.4 F (36.9 C) (Oral)   Ht 5\' 10"  (1.778 m)   Wt 186 lb (84.4 kg)   SpO2 95%   BMI 26.69 kg/m  VS noted,  Constitutional: Pt is oriented to person, place, and time. Appears well-developed and well-nourished, in no significant distress and comfortable Head: Normocephalic and atraumatic  Eyes: Conjunctivae and EOM are normal. Pupils are equal, round, and reactive to light Right Ear: External ear normal without discharge Left Ear: External ear normal without discharge Nose: Nose  without discharge or deformity Mouth/Throat: Oropharynx is without other ulcerations and moist  Neck: Normal range of motion. Neck supple. No JVD present. No tracheal deviation present or significant neck LA or mass Cardiovascular: Normal rate, regular rhythm, normal heart  sounds and intact distal pulses.   Pulmonary/Chest: WOB normal and breath sounds without rales or wheezing  Abdominal: Soft. Bowel sounds are normal. NT. No HSM  Musculoskeletal: Normal range of motion. Exhibits no edema Lymphadenopathy: Has no other cervical adenopathy.  Neurological: Pt is alert and oriented to person, place, and time. Pt has normal reflexes. No cranial nerve deficit. Motor grossly intact, Gait intact Skin: Skin is warm and dry. No rash noted or new ulcerations Psychiatric:  Has normal mood and affect. Behavior is normal without agitation No other exam findings Lab Results  Component Value Date   WBC 5.9 04/29/2018   HGB 14.6 04/29/2018   HCT 43.5 04/29/2018   PLT 193.0 04/29/2018   GLUCOSE 78 04/29/2018   CHOL 201 (H) 04/29/2018   TRIG 312.0 (H) 04/29/2018   HDL 64.40 04/29/2018   LDLDIRECT 100.0 04/29/2018   LDLCALC 173 (H) 03/27/2016   ALT 65 (H) 04/29/2018   AST 44 (H) 04/29/2018   NA 140 04/29/2018   K 4.5 04/29/2018   CL 103 04/29/2018   CREATININE 0.91 04/29/2018   BUN 14 04/29/2018   CO2 32 04/29/2018   TSH 1.98 04/29/2018   PSA 0.81 04/29/2018   HGBA1C 6.0 04/29/2018        Assessment & Plan:

## 2018-04-29 NOTE — Assessment & Plan Note (Signed)

## 2018-04-29 NOTE — Patient Instructions (Addendum)
Please return for the Prevnar 13 pneumonia shot if you find your insurance will cover  Let us know if you would want the shingles shot prescription sent to your pharmacy  Please call if you would want to schedule the Cardiac CT Calcium Scoring test  Please continue all other medications as before, and refills have been done if requested.  Please have the pharmacy call with any other refills you may need.  Please continue your efforts at being more active, low cholesterol diet, and weight control.  You are otherwise up to date with prevention measures today.  Please keep your appointments with your specialists as you may have planned  Please go to the LAB in the Basement (turn left off the elevator) for the tests to be done today  You will be contacted by phone if any changes need to be made immediately.  Otherwise, you will receive a letter about your results with an explanation, but please check with MyChart first.  Please remember to sign up for MyChart if you have not done so, as this will be important to you in the future with finding out test results, communicating by private email, and scheduling acute appointments online when needed.  Please return in 1 year for your yearly visit, or sooner if needed, with Lab testing done 3-5 days before

## 2018-04-29 NOTE — Assessment & Plan Note (Signed)
stable overall by history and exam, recent data reviewed with pt, and pt to continue medical treatment as before,  to f/u any worsening symptoms or concerns  

## 2018-04-29 NOTE — Assessment & Plan Note (Signed)
D/w pt, to recheck lipids, consider statin for ldl > 100

## 2018-04-29 NOTE — Assessment & Plan Note (Signed)
Stable, for a1c with labs 

## 2018-05-03 ENCOUNTER — Encounter: Payer: Self-pay | Admitting: Internal Medicine

## 2018-05-04 MED ORDER — ROSUVASTATIN CALCIUM 20 MG PO TABS: 20.0000 mg | ORAL_TABLET | Freq: Every day | ORAL | 3 refills | Status: DC

## 2018-10-22 DIAGNOSIS — R972 Elevated prostate specific antigen [PSA]: Secondary | ICD-10-CM | POA: Diagnosis not present

## 2018-10-29 DIAGNOSIS — N3000 Acute cystitis without hematuria: Secondary | ICD-10-CM | POA: Diagnosis not present

## 2018-10-29 DIAGNOSIS — R972 Elevated prostate specific antigen [PSA]: Secondary | ICD-10-CM | POA: Diagnosis not present

## 2018-10-29 DIAGNOSIS — N5201 Erectile dysfunction due to arterial insufficiency: Secondary | ICD-10-CM | POA: Diagnosis not present

## 2019-05-05 ENCOUNTER — Encounter: Payer: Self-pay | Admitting: Internal Medicine

## 2019-05-05 ENCOUNTER — Ambulatory Visit (INDEPENDENT_AMBULATORY_CARE_PROVIDER_SITE_OTHER): Payer: Medicare Other | Admitting: Internal Medicine

## 2019-05-05 ENCOUNTER — Other Ambulatory Visit: Payer: Self-pay

## 2019-05-05 VITALS — BP 140/82 | HR 96 | Temp 98.9°F | Ht 70.0 in | Wt 184.8 lb

## 2019-05-05 DIAGNOSIS — E538 Deficiency of other specified B group vitamins: Secondary | ICD-10-CM

## 2019-05-05 DIAGNOSIS — R739 Hyperglycemia, unspecified: Secondary | ICD-10-CM | POA: Diagnosis not present

## 2019-05-05 DIAGNOSIS — E559 Vitamin D deficiency, unspecified: Secondary | ICD-10-CM

## 2019-05-05 DIAGNOSIS — E611 Iron deficiency: Secondary | ICD-10-CM | POA: Diagnosis not present

## 2019-05-05 DIAGNOSIS — Z23 Encounter for immunization: Secondary | ICD-10-CM

## 2019-05-05 DIAGNOSIS — Z0000 Encounter for general adult medical examination without abnormal findings: Secondary | ICD-10-CM

## 2019-05-05 DIAGNOSIS — Z125 Encounter for screening for malignant neoplasm of prostate: Secondary | ICD-10-CM | POA: Diagnosis not present

## 2019-05-05 LAB — CBC WITH DIFFERENTIAL/PLATELET
Basophils Absolute: 0 10*3/uL (ref 0.0–0.1)
Basophils Relative: 0.7 % (ref 0.0–3.0)
Eosinophils Absolute: 0.3 10*3/uL (ref 0.0–0.7)
Eosinophils Relative: 5.7 % — ABNORMAL HIGH (ref 0.0–5.0)
HCT: 44.3 % (ref 39.0–52.0)
Hemoglobin: 14.9 g/dL (ref 13.0–17.0)
Lymphocytes Relative: 38.6 % (ref 12.0–46.0)
Lymphs Abs: 2.1 10*3/uL (ref 0.7–4.0)
MCHC: 33.5 g/dL (ref 30.0–36.0)
MCV: 92.2 fl (ref 78.0–100.0)
Monocytes Absolute: 0.8 10*3/uL (ref 0.1–1.0)
Monocytes Relative: 15.1 % — ABNORMAL HIGH (ref 3.0–12.0)
Neutro Abs: 2.2 10*3/uL (ref 1.4–7.7)
Neutrophils Relative %: 39.9 % — ABNORMAL LOW (ref 43.0–77.0)
Platelets: 201 10*3/uL (ref 150.0–400.0)
RBC: 4.81 Mil/uL (ref 4.22–5.81)
RDW: 12.2 % (ref 11.5–15.5)
WBC: 5.5 10*3/uL (ref 4.0–10.5)

## 2019-05-05 LAB — HEPATIC FUNCTION PANEL
ALT: 49 U/L (ref 0–53)
AST: 31 U/L (ref 0–37)
Albumin: 4.3 g/dL (ref 3.5–5.2)
Alkaline Phosphatase: 59 U/L (ref 39–117)
Bilirubin, Direct: 0.1 mg/dL (ref 0.0–0.3)
Total Bilirubin: 0.8 mg/dL (ref 0.2–1.2)
Total Protein: 7.1 g/dL (ref 6.0–8.3)

## 2019-05-05 LAB — TSH: TSH: 2.28 u[IU]/mL (ref 0.35–4.50)

## 2019-05-05 LAB — BASIC METABOLIC PANEL
BUN: 19 mg/dL (ref 6–23)
CO2: 30 mEq/L (ref 19–32)
Calcium: 9.3 mg/dL (ref 8.4–10.5)
Chloride: 103 mEq/L (ref 96–112)
Creatinine, Ser: 0.94 mg/dL (ref 0.40–1.50)
GFR: 79.79 mL/min (ref >60.00)
Glucose, Bld: 87 mg/dL (ref 70–99)
Potassium: 4 mEq/L (ref 3.5–5.1)
Sodium: 139 mEq/L (ref 135–145)

## 2019-05-05 LAB — IBC PANEL
Iron: 84 ug/dL (ref 42–165)
Saturation Ratios: 30.3 % (ref 20.0–50.0)
Transferrin: 198 mg/dL — ABNORMAL LOW (ref 212.0–360.0)

## 2019-05-05 LAB — LIPID PANEL
Cholesterol: 197 mg/dL (ref 0–200)
HDL: 56.4 mg/dL (ref >39.00)
NonHDL: 140.43
Total CHOL/HDL Ratio: 3
Triglycerides: 218 mg/dL — ABNORMAL HIGH (ref 0.0–149.0)
VLDL: 43.6 mg/dL — ABNORMAL HIGH (ref 0.0–40.0)

## 2019-05-05 LAB — PSA: PSA: 0.84 ng/mL (ref 0.10–4.00)

## 2019-05-05 LAB — LDL CHOLESTEROL, DIRECT: Direct LDL: 101 mg/dL

## 2019-05-05 LAB — HEMOGLOBIN A1C: Hgb A1c MFr Bld: 6 % (ref 4.6–6.5)

## 2019-05-05 MED ORDER — ROSUVASTATIN CALCIUM 20 MG PO TABS: 20.0000 mg | ORAL_TABLET | Freq: Every day | ORAL | 3 refills | Status: DC

## 2019-05-05 NOTE — Addendum Note (Signed)
Addended by: Otilio Miu on: 05/05/2019 05:03 PM   Modules accepted: Orders

## 2019-05-05 NOTE — Patient Instructions (Addendum)
You had Prevnar 13 pneumonia shot today  Please continue all other medications as before, and refills have been done if requested.  Please have the pharmacy call with any other refills you may need.  Please continue your efforts at being more active, low cholesterol diet, and weight control.  You are otherwise up to date with prevention measures today.  Please keep your appointments with your specialists as you may have planned  Please go to the LAB at the blood drawing area for the tests to be done  You will be contacted by phone if any changes need to be made immediately.  Otherwise, you will receive a letter about your results with an explanation, but please check with MyChart first.  Please remember to sign up for MyChart if you have not done so, as this will be important to you in the future with finding out test results, communicating by private email, and scheduling acute appointments online when needed.  Please make an Appointment to return for your 1 year visit, or sooner if needed, with Lab testing by Appointment as well, to be done about 3-5 days before at the [facility] (so this is for TWO appointments - please see the scheduling desk as you leave)

## 2019-05-05 NOTE — Assessment & Plan Note (Signed)
stable overall by history and exam, recent data reviewed with pt, and pt to continue medical treatment as before,  to f/u any worsening symptoms or concerns  

## 2019-05-05 NOTE — Addendum Note (Signed)
Addended by: Isaiah Serge D on: 05/05/2019 04:00 PM   Modules accepted: Orders

## 2019-05-05 NOTE — Addendum Note (Signed)
Addended by: Isaiah Serge D on: 05/05/2019 03:59 PM   Modules accepted: Orders

## 2019-05-05 NOTE — Progress Notes (Signed)
Subjective:    Patient ID: Jeffrey Rose, male    DOB: 12-28-1950, 69 y.o.   MRN: [id number]  HPI  Here for wellness and f/u;  Overall doing ok;  Pt denies Chest pain, worsening SOB, DOE, wheezing, orthopnea, PND, worsening LE edema, palpitations, dizziness or syncope.  Pt denies neurological change such as new headache, facial or extremity weakness.  Pt denies polydipsia, polyuria, or low sugar symptoms. Pt states overall good compliance with treatment and medications, good tolerability, and has been trying to follow appropriate diet.  Pt denies worsening depressive symptoms, suicidal ideation or panic. No fever, night sweats, wt loss, loss of appetite, or other constitutional symptoms.  Pt states good ability with ADL's, has low fall risk, home safety reviewed and adequate, no other significant changes in hearing or vision, and only occasionally active with exercise. Has covid vaccine appt for next Sunday. No new complaints Past Medical History:  Diagnosis Date  . Arthritis   . Hyperlipidemia   . Hypertension   . Urinary tract infection    Past Surgical History:  Procedure Laterality Date  . BUNIONECTOMY Bilateral   . oral growth removed     oral growth removed from mouth.    reports that he has quit smoking. He has never used smokeless tobacco. He reports current alcohol use of about 2.0 - 3.0 standard drinks of alcohol per week. He reports that he does not use drugs. family history includes Diabetes in his mother; Emphysema in his father; Heart disease in his father; Kidney disease in his mother. No Known Allergies Current Outpatient Medications on File Prior to Visit  Medication Sig Dispense Refill  . aspirin 81 MG tablet Take 1 tablet (81 mg total) by mouth daily. 90 tablet 3  . rosuvastatin (CRESTOR) 20 MG tablet Take 1 tablet (20 mg total) by mouth daily. 90 tablet 3   No current facility-administered medications on file prior to visit.   Review of Systems All otherwise neg  per pt     Objective:   Physical Exam BP 140/82   Pulse 96   Temp 98.9 F (37.2 C)   Ht 5\' 10"  (1.778 m)   Wt 184 lb 12.8 oz (83.8 kg)   SpO2 99%   BMI 26.52 kg/m  VS noted,  Constitutional: Pt appears in NAD HENT: Head: NCAT.  Right Ear: External ear normal.  Left Ear: External ear normal.  Eyes: . Pupils are equal, round, and reactive to light. Conjunctivae and EOM are normal Nose: without d/c or deformity Neck: Neck supple. Gross normal ROM Cardiovascular: Normal rate and regular rhythm.   Pulmonary/Chest: Effort normal and breath sounds without rales or wheezing.  Abd:  Soft, NT, ND, + BS, no organomegaly Neurological: Pt is alert. At baseline orientation, motor grossly intact Skin: Skin is warm. No rashes, other new lesions, no LE edema Psychiatric: Pt behavior is normal without agitation  Lab Results  Component Value Date   WBC 5.9 04/29/2018   HGB 14.6 04/29/2018   HCT 43.5 04/29/2018   PLT 193.0 04/29/2018   GLUCOSE 78 04/29/2018   CHOL 201 (H) 04/29/2018   TRIG 312.0 (H) 04/29/2018   HDL 64.40 04/29/2018   LDLDIRECT 100.0 04/29/2018   LDLCALC 173 (H) 03/27/2016   ALT 65 (H) 04/29/2018   AST 44 (H) 04/29/2018   NA 140 04/29/2018   K 4.5 04/29/2018   CL 103 04/29/2018   CREATININE 0.91 04/29/2018   BUN 14 04/29/2018   CO2 32 04/29/2018  TSH 1.98 04/29/2018   PSA 0.81 04/29/2018   HGBA1C 6.0 04/29/2018      Assessment & Plan:

## 2019-05-05 NOTE — Assessment & Plan Note (Signed)

## 2019-05-06 ENCOUNTER — Other Ambulatory Visit: Payer: Self-pay | Admitting: Internal Medicine

## 2019-05-06 LAB — URINALYSIS, ROUTINE W REFLEX MICROSCOPIC
Bilirubin Urine: NEGATIVE
Hgb urine dipstick: NEGATIVE
Ketones, ur: NEGATIVE
Leukocytes,Ua: NEGATIVE
Nitrite: NEGATIVE
RBC / HPF: NONE SEEN (ref 0–?)
Specific Gravity, Urine: >=1.03 — AB (ref 1.000–1.030)
Total Protein, Urine: NEGATIVE
Urine Glucose: NEGATIVE
Urobilinogen, UA: 0.2 (ref 0.0–1.0)
WBC, UA: NONE SEEN (ref 0–?)
pH: 5.5 (ref 5.0–8.0)

## 2019-05-06 LAB — VITAMIN D 25 HYDROXY (VIT D DEFICIENCY, FRACTURES): VITD: 27.02 ng/mL — ABNORMAL LOW (ref 30.00–100.00)

## 2019-05-06 LAB — VITAMIN B12: Vitamin B-12: 517 pg/mL (ref 211–911)

## 2019-05-06 MED ORDER — VITAMIN D (ERGOCALCIFEROL) 1.25 MG (50000 UNIT) PO CAPS: 50000.0000 [IU] | ORAL_CAPSULE | ORAL | 0 refills | Status: DC

## 2019-05-07 ENCOUNTER — Telehealth: Payer: Self-pay | Admitting: Internal Medicine

## 2019-05-07 NOTE — Telephone Encounter (Signed)
Patient has called back in regard to labs.  I have given him Dr. Gwynn Burly message and he understands.

## 2019-10-26 DIAGNOSIS — R972 Elevated prostate specific antigen [PSA]: Secondary | ICD-10-CM | POA: Diagnosis not present

## 2019-11-02 DIAGNOSIS — N5201 Erectile dysfunction due to arterial insufficiency: Secondary | ICD-10-CM | POA: Diagnosis not present

## 2019-11-02 DIAGNOSIS — N3000 Acute cystitis without hematuria: Secondary | ICD-10-CM | POA: Diagnosis not present

## 2019-11-02 DIAGNOSIS — R972 Elevated prostate specific antigen [PSA]: Secondary | ICD-10-CM | POA: Diagnosis not present

## 2020-05-03 ENCOUNTER — Other Ambulatory Visit: Payer: Medicare Other

## 2020-05-03 ENCOUNTER — Other Ambulatory Visit (INDEPENDENT_AMBULATORY_CARE_PROVIDER_SITE_OTHER): Payer: Medicare Other

## 2020-05-03 ENCOUNTER — Other Ambulatory Visit: Payer: Self-pay

## 2020-05-03 DIAGNOSIS — E538 Deficiency of other specified B group vitamins: Secondary | ICD-10-CM

## 2020-05-03 DIAGNOSIS — E559 Vitamin D deficiency, unspecified: Secondary | ICD-10-CM

## 2020-05-03 DIAGNOSIS — Z0000 Encounter for general adult medical examination without abnormal findings: Secondary | ICD-10-CM | POA: Diagnosis not present

## 2020-05-03 DIAGNOSIS — R739 Hyperglycemia, unspecified: Secondary | ICD-10-CM | POA: Diagnosis not present

## 2020-05-03 DIAGNOSIS — Z125 Encounter for screening for malignant neoplasm of prostate: Secondary | ICD-10-CM

## 2020-05-03 LAB — URINALYSIS, ROUTINE W REFLEX MICROSCOPIC
Bilirubin Urine: NEGATIVE
Ketones, ur: NEGATIVE
Leukocytes,Ua: NEGATIVE
Nitrite: NEGATIVE
Specific Gravity, Urine: >=1.03 — AB (ref 1.000–1.030)
Total Protein, Urine: NEGATIVE
Urine Glucose: NEGATIVE
Urobilinogen, UA: 0.2 (ref 0.0–1.0)
pH: 5.5 (ref 5.0–8.0)

## 2020-05-03 LAB — HEPATIC FUNCTION PANEL
ALT: 52 U/L (ref 0–53)
AST: 31 U/L (ref 0–37)
Albumin: 4.5 g/dL (ref 3.5–5.2)
Alkaline Phosphatase: 61 U/L (ref 39–117)
Bilirubin, Direct: 0.2 mg/dL (ref 0.0–0.3)
Total Bilirubin: 1.5 mg/dL — ABNORMAL HIGH (ref 0.2–1.2)
Total Protein: 7 g/dL (ref 6.0–8.3)

## 2020-05-03 LAB — BASIC METABOLIC PANEL
BUN: 19 mg/dL (ref 6–23)
CO2: 32 mEq/L (ref 19–32)
Calcium: 9.4 mg/dL (ref 8.4–10.5)
Chloride: 105 mEq/L (ref 96–112)
Creatinine, Ser: 0.91 mg/dL (ref 0.40–1.50)
GFR: 86.25 mL/min (ref >60.00)
Glucose, Bld: 103 mg/dL — ABNORMAL HIGH (ref 70–99)
Potassium: 4.7 mEq/L (ref 3.5–5.1)
Sodium: 141 mEq/L (ref 135–145)

## 2020-05-03 LAB — LIPID PANEL
Cholesterol: 198 mg/dL (ref 0–200)
HDL: 56.4 mg/dL (ref >39.00)
LDL Cholesterol: 111 mg/dL — ABNORMAL HIGH (ref 0–99)
NonHDL: 141.36
Total CHOL/HDL Ratio: 4
Triglycerides: 150 mg/dL — ABNORMAL HIGH (ref 0.0–149.0)
VLDL: 30 mg/dL (ref 0.0–40.0)

## 2020-05-03 LAB — CBC WITH DIFFERENTIAL/PLATELET
Basophils Absolute: 0 10*3/uL (ref 0.0–0.1)
Basophils Relative: 0.7 % (ref 0.0–3.0)
Eosinophils Absolute: 0.2 10*3/uL (ref 0.0–0.7)
Eosinophils Relative: 4.1 % (ref 0.0–5.0)
HCT: 46.2 % (ref 39.0–52.0)
Hemoglobin: 15.4 g/dL (ref 13.0–17.0)
Lymphocytes Relative: 38.4 % (ref 12.0–46.0)
Lymphs Abs: 2.1 10*3/uL (ref 0.7–4.0)
MCHC: 33.4 g/dL (ref 30.0–36.0)
MCV: 91.2 fl (ref 78.0–100.0)
Monocytes Absolute: 0.7 10*3/uL (ref 0.1–1.0)
Monocytes Relative: 12.2 % — ABNORMAL HIGH (ref 3.0–12.0)
Neutro Abs: 2.5 10*3/uL (ref 1.4–7.7)
Neutrophils Relative %: 44.6 % (ref 43.0–77.0)
Platelets: 187 10*3/uL (ref 150.0–400.0)
RBC: 5.07 Mil/uL (ref 4.22–5.81)
RDW: 12.1 % (ref 11.5–15.5)
WBC: 5.5 10*3/uL (ref 4.0–10.5)

## 2020-05-03 LAB — PSA: PSA: 1.07 ng/mL (ref 0.10–4.00)

## 2020-05-03 LAB — VITAMIN B12: Vitamin B-12: 569 pg/mL (ref 211–911)

## 2020-05-03 LAB — VITAMIN D 25 HYDROXY (VIT D DEFICIENCY, FRACTURES): VITD: 41.37 ng/mL (ref 30.00–100.00)

## 2020-05-03 LAB — TSH: TSH: 1.19 u[IU]/mL (ref 0.35–4.50)

## 2020-05-03 LAB — HEMOGLOBIN A1C: Hgb A1c MFr Bld: 6.1 % (ref 4.6–6.5)

## 2020-05-09 ENCOUNTER — Other Ambulatory Visit: Payer: Self-pay

## 2020-05-10 ENCOUNTER — Ambulatory Visit (INDEPENDENT_AMBULATORY_CARE_PROVIDER_SITE_OTHER): Payer: Medicare Other | Admitting: Internal Medicine

## 2020-05-10 ENCOUNTER — Encounter: Payer: Self-pay | Admitting: Internal Medicine

## 2020-05-10 VITALS — BP 122/90 | HR 70 | Temp 98.7°F | Ht 70.0 in | Wt 189.0 lb

## 2020-05-10 DIAGNOSIS — E782 Mixed hyperlipidemia: Secondary | ICD-10-CM | POA: Diagnosis not present

## 2020-05-10 DIAGNOSIS — E559 Vitamin D deficiency, unspecified: Secondary | ICD-10-CM

## 2020-05-10 DIAGNOSIS — Z0000 Encounter for general adult medical examination without abnormal findings: Secondary | ICD-10-CM

## 2020-05-10 DIAGNOSIS — I10 Essential (primary) hypertension: Secondary | ICD-10-CM

## 2020-05-10 DIAGNOSIS — R739 Hyperglycemia, unspecified: Secondary | ICD-10-CM | POA: Diagnosis not present

## 2020-05-10 DIAGNOSIS — Z0001 Encounter for general adult medical examination with abnormal findings: Secondary | ICD-10-CM

## 2020-05-10 MED ORDER — ROSUVASTATIN CALCIUM 40 MG PO TABS: 40.0000 mg | ORAL_TABLET | Freq: Every day | ORAL | 3 refills | Status: DC

## 2020-05-10 NOTE — Progress Notes (Signed)
Established Patient Office Visit  Subjective:  Patient ID: Jeffrey Rose, male    DOB: 1950/05/16  Age: 70 y.o. MRN: [id number]       Chief Complaint:: wellness exam and HLD       HPI:  Jeffrey Rose is a 70 y.o. male here for wellness exam; overall doing well, without specific complaints, trying to follow a lower cholesterol diet.  Pt denies chest pain, increased sob or doe, wheezing, orthopnea, PND, increased LE swelling, palpitations, dizziness or syncope.  Pt denies new neurological symptoms such as new headache, or facial or extremity weakness or numbness   Pt denies polydipsia, polyuria,  Wt Readings from Last 3 Encounters:  05/10/20 189 lb (85.7 kg)  05/05/19 184 lb 12.8 oz (83.8 kg)  04/29/18 186 lb (84.4 kg)   BP Readings from Last 3 Encounters:  05/10/20 122/90  05/05/19 140/82  04/29/18 130/80   Immunization History  Administered Date(s) Administered  . Influenza Split 01/27/2013  . Influenza-Unspecified 02/13/2017, 02/27/2018, 02/06/2020  . Moderna SARS-COV2 Booster Vaccination 02/06/2020  . Moderna Sars-Covid-2 Vaccination 05/20/2019, 06/15/2019  . Pneumococcal Conjugate-13 05/05/2019  . Pneumococcal Polysaccharide-23 04/25/2017  . Tdap 03/27/2016  There are no preventive care reminders to display for this patient.        Past Medical History:  Diagnosis Date  . Arthritis   . Hyperlipidemia   . Hypertension   . Urinary tract infection    Past Surgical History:  Procedure Laterality Date  . BUNIONECTOMY Bilateral   . oral growth removed     oral growth removed from mouth.    reports that he has quit smoking. He has never used smokeless tobacco. He reports current alcohol use of about 2.0 - 3.0 standard drinks of alcohol per week. He reports that he does not use drugs. family history includes Diabetes in his mother; Emphysema in his father; Heart disease in his father; Kidney disease in his mother. No Known Allergies Current Outpatient Medications on  File Prior to Visit  Medication Sig Dispense Refill  . aspirin 81 MG tablet Take 1 tablet (81 mg total) by mouth daily. 90 tablet 3  . Cholecalciferol (VITAMIN D-3 PO) Take 1 tablet by mouth daily.     No current facility-administered medications on file prior to visit.        ROS:  All others reviewed and negative.  Objective        PE:  BP 122/90   Pulse 70   Temp 98.7 F (37.1 C) (Oral)   Ht 5\' 10"  (1.778 m)   Wt 189 lb (85.7 kg)   SpO2 95%   BMI 27.12 kg/m                 Constitutional: Pt appears in NAD               HENT: Head: NCAT.                Right Ear: External ear normal.                 Left Ear: External ear normal.                Eyes: . Pupils are equal, round, and reactive to light. Conjunctivae and EOM are normal               Nose: without d/c or deformity               Neck: Neck  supple. Gross normal ROM               Cardiovascular: Normal rate and regular rhythm.                 Pulmonary/Chest: Effort normal and breath sounds without rales or wheezing.                Abd:  Soft, NT, ND, + BS, no organomegaly               Neurological: Pt is alert. At baseline orientation, motor grossly intact               Skin: Skin is warm. No rashes, no other new lesions, LE edema - none               Psychiatric: Pt behavior is normal without agitation   Assessment/Plan:  Jeffrey Rose is a 70 y.o. White or Caucasian [1] male with  has a past medical history of Arthritis, Hyperlipidemia, Hypertension, and Urinary tract infection.  Micro: none  Cardiac tracings I have personally interpreted today:  none  Pertinent Radiological findings (summarize): none   Lab Results  Component Value Date   WBC 5.5 05/03/2020   HGB 15.4 05/03/2020   HCT 46.2 05/03/2020   PLT 187.0 05/03/2020   GLUCOSE 103 (H) 05/03/2020   CHOL 198 05/03/2020   TRIG 150.0 (H) 05/03/2020   HDL 56.40 05/03/2020   LDLDIRECT 101.0 05/05/2019   LDLCALC 111 (H) 05/03/2020   ALT 52  05/03/2020   AST 31 05/03/2020   NA 141 05/03/2020   K 4.7 05/03/2020   CL 105 05/03/2020   CREATININE 0.91 05/03/2020   BUN 19 05/03/2020   CO2 32 05/03/2020   TSH 1.19 05/03/2020   PSA 1.07 05/03/2020   HGBA1C 6.1 05/03/2020       Assessment & Plan:   Problem List Items Addressed This Visit      High   Encounter for well adult exam with abnormal findings - Primary    Age and sex appropriate education and counseling updated with regular exercise and diet Referrals for preventative services - none needed Immunizations addressed - none needed Smoking counseling  - none needed Evidence for depression or other mood disorder - none significant Most recent labs reviewed. I have personally reviewed and have noted: 1) the patient's medical and social history 2) The patient's current medications and supplements 3) The patient's height, weight, and BMI have been recorded in the chart         Medium   Vitamin D deficiency    Last vitamin D Lab Results  Component Value Date   VD25OH 41.37 05/03/2020   Stable, cont oral replacement      Mixed hyperlipidemia    Unconrolled, for increased crestor 40 qd, low chol diet and f/u lab next visit Lab Results  Component Value Date   LDLCALC 111 (H) 05/03/2020        Relevant Medications   rosuvastatin (CRESTOR) 40 MG tablet   Hyperglycemia    Lab Results  Component Value Date   HGBA1C 6.1 05/03/2020   Stable, pt to continue current medical treatment  - diet        Essential hypertension    BP Readings from Last 3 Encounters:  05/10/20 122/90  05/05/19 140/82  04/29/18 130/80   Stable, pt to continue medical treatment  - none        Relevant Medications   rosuvastatin (CRESTOR)  40 MG tablet      Meds ordered this encounter  Medications  . rosuvastatin (CRESTOR) 40 MG tablet    Sig: Take 1 tablet (40 mg total) by mouth daily.    Dispense:  90 tablet    Refill:  3    Follow-up: Return in about 1 year  (around 05/10/2021).   Cathlean Cower, MD 05/13/2020 8:44 PM [facility]

## 2020-05-10 NOTE — Patient Instructions (Signed)
Ok to increase the crestor to 40 mg per day  Please continue all other medications as before, and refills have been done if requested.  Please have the pharmacy call with any other refills you may need.  Please continue your efforts at being more active, low cholesterol diet, and weight control.  You are otherwise up to date with prevention measures today.  Please keep your appointments with your specialists as you may have planned  Please make an Appointment to return for your 1 year visit, or sooner if needed, with Lab testing by Appointment as well, to be done about 3-5 days before at the [facility] (so this is for TWO appointments - please see the scheduling desk as you leave)  Due to the ongoing Covid 19 pandemic, our lab now requires an appointment for any labs done at our office.  If you need labs done and do not have an appointment, please call our office ahead of time to schedule before presenting to the lab for your testing.

## 2020-05-13 ENCOUNTER — Encounter: Payer: Self-pay | Admitting: Internal Medicine

## 2020-05-13 NOTE — Assessment & Plan Note (Signed)

## 2020-05-13 NOTE — Assessment & Plan Note (Addendum)
Unconrolled, for increased crestor 40 qd, low chol diet and f/u lab next visit Lab Results  Component Value Date   LDLCALC 111 (H) 05/03/2020

## 2020-05-13 NOTE — Assessment & Plan Note (Signed)
Last vitamin D Lab Results  Component Value Date   VD25OH 41.37 05/03/2020   Stable, cont oral replacement

## 2020-05-13 NOTE — Addendum Note (Signed)
Addended by: Biagio Borg on: 05/13/2020 08:50 PM   Modules accepted: Orders

## 2020-05-13 NOTE — Assessment & Plan Note (Signed)
Lab Results  Component Value Date   HGBA1C 6.1 05/03/2020   Stable, pt to continue current medical treatment  - diet

## 2020-05-13 NOTE — Assessment & Plan Note (Signed)
BP Readings from Last 3 Encounters:  05/10/20 122/90  05/05/19 140/82  04/29/18 130/80   Stable, pt to continue medical treatment  - none

## 2020-06-19 ENCOUNTER — Other Ambulatory Visit: Payer: Self-pay

## 2020-06-19 ENCOUNTER — Emergency Department (HOSPITAL_COMMUNITY): Payer: Medicare Other

## 2020-06-19 ENCOUNTER — Emergency Department (HOSPITAL_COMMUNITY)
Admission: EM | Admit: 2020-06-19 | Discharge: 2020-06-19 | Disposition: A | Discharge status: Home / Self Care | Payer: Medicare Other | Attending: Emergency Medicine | Admitting: Emergency Medicine

## 2020-06-19 DIAGNOSIS — N201 Calculus of ureter: Secondary | ICD-10-CM | POA: Diagnosis not present

## 2020-06-19 DIAGNOSIS — M47814 Spondylosis without myelopathy or radiculopathy, thoracic region: Secondary | ICD-10-CM | POA: Diagnosis not present

## 2020-06-19 DIAGNOSIS — N281 Cyst of kidney, acquired: Secondary | ICD-10-CM | POA: Diagnosis not present

## 2020-06-19 DIAGNOSIS — K219 Gastro-esophageal reflux disease without esophagitis: Secondary | ICD-10-CM | POA: Diagnosis not present

## 2020-06-19 DIAGNOSIS — I10 Essential (primary) hypertension: Secondary | ICD-10-CM | POA: Diagnosis not present

## 2020-06-19 DIAGNOSIS — Z87891 Personal history of nicotine dependence: Secondary | ICD-10-CM | POA: Insufficient documentation

## 2020-06-19 DIAGNOSIS — Z7982 Long term (current) use of aspirin: Secondary | ICD-10-CM | POA: Insufficient documentation

## 2020-06-19 DIAGNOSIS — K449 Diaphragmatic hernia without obstruction or gangrene: Secondary | ICD-10-CM | POA: Diagnosis not present

## 2020-06-19 DIAGNOSIS — R1031 Right lower quadrant pain: Secondary | ICD-10-CM | POA: Diagnosis present

## 2020-06-19 LAB — URINALYSIS, MICROSCOPIC (REFLEX): RBC / HPF: >50 RBC/hpf (ref 0–5)

## 2020-06-19 LAB — CBC
HCT: 45.2 % (ref 39.0–52.0)
Hemoglobin: 15.2 g/dL (ref 13.0–17.0)
MCH: 31.1 pg (ref 26.0–34.0)
MCHC: 33.6 g/dL (ref 30.0–36.0)
MCV: 92.4 fL (ref 80.0–100.0)
Platelets: 193 10*3/uL (ref 150–400)
RBC: 4.89 MIL/uL (ref 4.22–5.81)
RDW: 12 % (ref 11.5–15.5)
WBC: 4.8 10*3/uL (ref 4.0–10.5)
nRBC: 0 % (ref 0.0–0.2)

## 2020-06-19 LAB — URINALYSIS, ROUTINE W REFLEX MICROSCOPIC
Bilirubin Urine: NEGATIVE
Glucose, UA: NEGATIVE mg/dL
Ketones, ur: NEGATIVE mg/dL
Leukocytes,Ua: NEGATIVE
Nitrite: NEGATIVE
Protein, ur: 30 mg/dL — AB
Specific Gravity, Urine: >1.03 — ABNORMAL HIGH (ref 1.005–1.030)
pH: 5 (ref 5.0–8.0)

## 2020-06-19 LAB — COMPREHENSIVE METABOLIC PANEL
ALT: 35 U/L (ref 0–44)
AST: 32 U/L (ref 15–41)
Albumin: 4.3 g/dL (ref 3.5–5.0)
Alkaline Phosphatase: 57 U/L (ref 38–126)
Anion gap: 10 (ref 5–15)
BUN: 22 mg/dL (ref 8–23)
CO2: 23 mmol/L (ref 22–32)
Calcium: 9.4 mg/dL (ref 8.9–10.3)
Chloride: 104 mmol/L (ref 98–111)
Creatinine, Ser: 0.89 mg/dL (ref 0.61–1.24)
GFR, Estimated: >60 mL/min (ref >60)
Glucose, Bld: 117 mg/dL — ABNORMAL HIGH (ref 70–99)
Potassium: 4 mmol/L (ref 3.5–5.1)
Sodium: 137 mmol/L (ref 135–145)
Total Bilirubin: 2 mg/dL — ABNORMAL HIGH (ref 0.3–1.2)
Total Protein: 7.6 g/dL (ref 6.5–8.1)

## 2020-06-19 LAB — LIPASE, BLOOD: Lipase: 41 U/L (ref 11–51)

## 2020-06-19 MED ORDER — ONDANSETRON 8 MG PO TBDP: 8.0000 mg | ORAL_TABLET | Freq: Three times a day (TID) | ORAL | 0 refills | Status: DC | PRN

## 2020-06-19 MED ORDER — HYDROCODONE-ACETAMINOPHEN 5-325 MG PO TABS: 1.0000 | ORAL_TABLET | Freq: Four times a day (QID) | ORAL | 0 refills | Status: DC | PRN

## 2020-06-19 MED ORDER — TAMSULOSIN HCL 0.4 MG PO CAPS: 0.4000 mg | ORAL_CAPSULE | Freq: Every day | ORAL | 0 refills | Status: DC

## 2020-06-19 NOTE — ED Triage Notes (Signed)
Pt woke up this morning at 0300 with central abdominal pain that is now localized in RLQ. Denies n/v/d. Pain has decreased in the last couple of hours, NAD.

## 2020-06-19 NOTE — ED Provider Notes (Signed)
[facility] EMERGENCY DEPARTMENT Provider Note   CSN: [id number] Arrival date & time: 06/19/20  0741     History Chief Complaint  Patient presents with  . Abdominal Pain    Jeffrey Rose is a 70 y.o. male.  The history is provided by the patient.  Abdominal Pain Pain location: initially general then right lower abdomen. Pain quality: aching   Pain radiates to:  Does not radiate Pain severity:  Mild Onset quality:  Gradual Duration: since 3 am. Progression:  Improving Chronicity:  New Relieved by:  Nothing Worsened by:  Nothing Ineffective treatments:  None tried Associated symptoms: no anorexia, no dysuria, no fever, no nausea and no vomiting        Past Medical History:  Diagnosis Date  . Arthritis   . Hyperlipidemia   . Hypertension   . Urinary tract infection     Patient Active Problem List   Diagnosis Date Noted  . Vitamin D deficiency 05/10/2020  . Hyperglycemia 04/25/2017  . Medicare annual wellness visit, initial 03/27/2016  . Encounter for well adult exam with abnormal findings 03/27/2016  . Essential hypertension 03/27/2016  . Periodic health assessment, general screening, adult 01/27/2013  . ELBOW PAIN, LEFT 08/31/2009  . HIP PAIN, LEFT 08/31/2009  . Hallux rigidus 08/31/2009  . Mixed hyperlipidemia 06/19/2007  . GASTROESOPHAGEAL REFLUX, NO ESOPHAGITIS 06/12/2006    Past Surgical History:  Procedure Laterality Date  . BUNIONECTOMY Bilateral   . oral growth removed     oral growth removed from mouth.       Family History  Problem Relation Age of Onset  . Kidney disease Mother   . Diabetes Mother   . Emphysema Father   . Heart disease Father   . Colon cancer Neg Hx   . Esophageal cancer Neg Hx   . Rectal cancer Neg Hx   . Stomach cancer Neg Hx     Social History   Tobacco Use  . Smoking status: Former Research scientist (life sciences)  . Smokeless tobacco: Never Used  . Tobacco comment: smoke pipe, quit 20 years ago  Substance Use  Topics  . Alcohol use: Yes    Alcohol/week: 2.0 - 3.0 standard drinks    Types: 2 - 3 Glasses of wine per week    Comment: occasionally   . Drug use: No    Home Medications Prior to Admission medications   Medication Sig Start Date End Date Taking? Authorizing Provider  HYDROcodone-acetaminophen (NORCO/VICODIN) 5-325 MG tablet Take 1 tablet by mouth every 6 (six) hours as needed. 06/19/20  Yes Dorie Rank, MD  ondansetron (ZOFRAN ODT) 8 MG disintegrating tablet Take 1 tablet (8 mg total) by mouth every 8 (eight) hours as needed for nausea or vomiting. 06/19/20  Yes Dorie Rank, MD  tamsulosin (FLOMAX) 0.4 MG CAPS capsule Take 1 capsule (0.4 mg total) by mouth daily. 06/19/20  Yes Dorie Rank, MD  aspirin 81 MG tablet Take 1 tablet (81 mg total) by mouth daily. 04/25/17   Biagio Borg, MD  Cholecalciferol (VITAMIN D-3 PO) Take 1 tablet by mouth daily.    [provider]  rosuvastatin (CRESTOR) 40 MG tablet Take 1 tablet (40 mg total) by mouth daily. 05/10/20   Biagio Borg, MD    Allergies    Patient has no known allergies.  Review of Systems   Review of Systems  Constitutional: Negative for fever.  Gastrointestinal: Positive for abdominal pain. Negative for anorexia, nausea and vomiting.  Genitourinary: Negative for  dysuria.  All other systems reviewed and are negative.   Physical Exam Updated Vital Signs BP 131/81   Pulse (!) 57   Temp 98.3 F (36.8 C)   Resp 14   Ht 1.778 m (5\' 10" )   Wt 81.6 kg   SpO2 96%   BMI 25.83 kg/m   Physical Exam  ED Results / Procedures / Treatments   Labs (all labs ordered are listed, but only abnormal results are displayed) Labs Reviewed  COMPREHENSIVE METABOLIC PANEL - Abnormal; Notable for the following components:      Result Value   Glucose, Bld 117 (*)    Total Bilirubin 2.0 (*)    All other components within normal limits  URINALYSIS, ROUTINE W REFLEX MICROSCOPIC - Abnormal; Notable for the following components:   Color,  Urine AMBER (*)    APPearance CLOUDY (*)    Specific Gravity, Urine >1.030 (*)    Hgb urine dipstick LARGE (*)    Protein, ur 30 (*)    All other components within normal limits  URINALYSIS, MICROSCOPIC (REFLEX) - Abnormal; Notable for the following components:   Bacteria, UA FEW (*)    Non Squamous Epithelial PRESENT (*)    All other components within normal limits  LIPASE, BLOOD  CBC    EKG None  Radiology CT Renal Stone Study  Result Date: 06/19/2020 CLINICAL DATA:  Flank pain, kidney stone suspected EXAM: CT ABDOMEN AND PELVIS WITHOUT CONTRAST TECHNIQUE: Multidetector CT imaging of the abdomen and pelvis was performed following the standard protocol without IV contrast. COMPARISON:  03/23/2013 FINDINGS: Lower chest: Lung bases are clear. Hepatobiliary: Unenhanced liver is unremarkable. Gallbladder is unremarkable. No intrahepatic or extrahepatic ductal dilatation. Pancreas: Within normal limits. Spleen: Within normal limits. Adrenals/Urinary Tract: Adrenal glands are within normal limits. 2.0 cm posterior right lower pole renal cyst. Left kidney is within normal limits. No renal calculi or hydronephrosis. 5 mm proximal right ureteral calculus at the L4 level (coronal image 43). Bladder is within normal limits. Stomach/Bowel: Stomach is notable for a tiny hiatal hernia. No evidence of bowel obstruction. Appendix is not discretely visualized. Vascular/Lymphatic: No evidence of abdominal aortic aneurysm. Atherosclerotic calcifications of the abdominal aorta and branch vessels. No suspicious abdominopelvic lymphadenopathy. Reproductive: Prostate is unremarkable. Other: No abdominopelvic ascites. Musculoskeletal: Mild degenerative changes of the lower thoracic spine. IMPRESSION: 5 mm proximal right ureteral calculus at the L4 level. No hydronephrosis. Electronically Signed   By: Julian Hy M.D.   On: 06/19/2020 12:23    Procedures Procedures   Medications Ordered in ED Medications -  No data to display  ED Course  I have reviewed the triage vital signs and the nursing notes.  Pertinent labs & imaging results that were available during my care of the patient were reviewed by me and considered in my medical decision making (see chart for details).  Clinical Course as of 06/19/20 1244  Mon Jun 19, 2020  1110 Labs reviewed.  CBC metabolic panel unremarkable.  Urinalysis primarily shows hematuria [JK]  1231 CT scan does show a 5 mm ureteral stone [JK]    Clinical Course User Index [JK] Dorie Rank, MD   MDM Rules/Calculators/A&P                          Patient presented to the ED for evaluation of abdominal pain.  Patient denies any other symptoms such as nausea vomiting or diarrhea.  Pain was primarily on the right side.  Symptoms concerning for the possibility of ureteral stone, diverticulitis appendicitis less likely.  ED work-up was notable for hematuria.  CT scan was performed and it does show a 5 mm ureteral stone.  Patient is comfortable in the ED.  Will discharge home with Flomax hydrocodone and Zofran.  Outpatient follow-up with urology.  Patient states he already has established care with a urologist. Final Clinical Impression(s) / ED Diagnoses Final diagnoses:  Right ureteral stone    Rx / DC Orders ED Discharge Orders         Ordered    HYDROcodone-acetaminophen (NORCO/VICODIN) 5-325 MG tablet  Every 6 hours PRN        06/19/20 1242    ondansetron (ZOFRAN ODT) 8 MG disintegrating tablet  Every 8 hours PRN        06/19/20 1242    tamsulosin (FLOMAX) 0.4 MG CAPS capsule  Daily        06/19/20 1242           Dorie Rank, MD 06/19/20 1244

## 2020-06-19 NOTE — Discharge Instructions (Addendum)
Take the medications as needed for pain and nausea.  Take the Flomax daily to help with passing the kidney stone.  Follow-up with your urologist as we discussed.  Use a urine strainer to help determine if you passed a kidney stone.  Return to the ED for fevers chills, severe pain.

## 2020-09-14 ENCOUNTER — Ambulatory Visit: Payer: Medicare Other | Admitting: Sports Medicine

## 2020-09-14 ENCOUNTER — Ambulatory Visit
Admission: RE | Admit: 2020-09-14 | Discharge: 2020-09-14 | Disposition: A | Discharge status: Home / Self Care | Payer: Medicare Other | Source: Ambulatory Visit | Attending: Sports Medicine | Admitting: Sports Medicine

## 2020-09-14 ENCOUNTER — Encounter: Payer: Self-pay | Admitting: Sports Medicine

## 2020-09-14 ENCOUNTER — Other Ambulatory Visit: Payer: Self-pay

## 2020-09-14 VITALS — BP 126/70 | Ht 70.0 in | Wt 185.0 lb

## 2020-09-14 DIAGNOSIS — M67959 Unspecified disorder of synovium and tendon, unspecified thigh: Secondary | ICD-10-CM | POA: Diagnosis not present

## 2020-09-14 DIAGNOSIS — M25552 Pain in left hip: Secondary | ICD-10-CM

## 2020-09-14 MED ORDER — NITROGLYCERIN 0.2 MG/HR TD PT24: MEDICATED_PATCH | TRANSDERMAL | 1 refills | Status: DC

## 2020-09-14 NOTE — Progress Notes (Addendum)
    SUBJECTIVE:   CHIEF COMPLAINT / HPI:   Jeffrey Rose is a 70 yr old male who presents with left hip pain   Hip pain  Patient reports 1 year history of left-sided lateral, hip pain.  He initially noticed daily while he was sitting and laying down however now it is more constant.  Pain is worsened by laying down.  Denies numbness, tingling, radiation of pain, injuries to the area or weakness of the lower extremity.  He takes the occasional ibuprofen.  Patient denies limping, back pain or knee pain on affected side. Patient mountain bikes often and has had no issues doing this.   PERTINENT  PMH / PSH: HTN, HLD  OBJECTIVE:   BP 126/70   Ht 5\' 10"  (1.778 m)   Wt 185 lb (83.9 kg)   BMI 26.54 kg/m    Hip:  - Inspection: No gross deformity, no swelling, erythema, or ecchymosis - Palpation: TTP on posterior to left greater trochanter over gluteus medius. Specifically none over greater trochanter - ROM: Normal range of motion on Flexion, extension, abduction, internal and external rotation - Strength: Normal strength. Specially 5/5 strength on resisted abduction  - Neuro/vasc: NV intact distally - Special Tests:  Negative Trendelenberg.   ASSESSMENT/PLAN:   Tendinopathy of gluteus medius Most likely tendinopathy of gluteus medius on the left side.  Will obtain left hip x-ray to rule out osteoarthritis.  Recommended gluteus medius strengthening exercises along with this we will nitroglycerin patches over the hips.  Follow-up in 6 weeks with Dr. Micheline Chapman.  Dr. Micheline Chapman will call patient with x-ray results.     Lattie Haw, MD [place]  Patient seen and evaluated with the resident.  I agree with the above plan of care.  Patient's x-rays show only mild degenerative changes.  He was notified via telephone of those results.  Treatment as above and follow-up in 6 weeks.  Consider physical therapy if no improvement.  Call with questions or concerns in the interim.

## 2020-09-14 NOTE — Patient Instructions (Addendum)
Thank you for coming to see me today. It was a pleasure. Today we discussed your hip pain. It is most likely due to gluteal medius tendinopathy. I recommend: Nitroglycerin patches, see info below Gluteal muscle strengthening exercises Left hip x-ray-Dr Micheline Chapman will call with the results  Follow up in 6 weeks with Dr Micheline Chapman  If you have any questions or concerns, please do not hesitate to call the office at [phone].  Best wishes,   Dr Posey Pronto    Nitroglycerin Protocol   Apply 1/4 nitroglycerin patch to affected area daily.  Change position of patch within the affected area every 24 hours.  You may experience a headache during the first 1-2 weeks of using the patch, these should subside.  If you experience headaches after beginning nitroglycerin patch treatment, you may take your preferred over the counter pain reliever.  Another side effect of the nitroglycerin patch is skin irritation or rash related to patch adhesive.  Please notify our office if you develop more severe headaches or rash, and stop the patch.  Tendon healing with nitroglycerin patch may require 12 to 24 weeks depending on the extent of injury.  Men should not use if taking Viagra, Cialis, or Levitra.   Do not use if you have migraines or rosacea.

## 2020-09-14 NOTE — Assessment & Plan Note (Signed)
Most likely tendinopathy of gluteus medius on the left side.  Will obtain left hip x-ray to rule out osteoarthritis.  Recommended gluteus medius strengthening exercises along with this we will nitroglycerin patches over the hips.  Follow-up in 6 weeks with Dr. Micheline Chapman.  Dr. Micheline Chapman will call patient with x-ray results.

## 2020-10-26 ENCOUNTER — Ambulatory Visit: Payer: Medicare Other | Admitting: Sports Medicine

## 2020-11-21 DIAGNOSIS — M65332 Trigger finger, left middle finger: Secondary | ICD-10-CM | POA: Diagnosis not present

## 2020-11-21 DIAGNOSIS — M19041 Primary osteoarthritis, right hand: Secondary | ICD-10-CM | POA: Diagnosis not present

## 2020-11-21 DIAGNOSIS — M720 Palmar fascial fibromatosis [Dupuytren]: Secondary | ICD-10-CM | POA: Diagnosis not present

## 2020-11-21 DIAGNOSIS — M65322 Trigger finger, left index finger: Secondary | ICD-10-CM | POA: Diagnosis not present

## 2020-11-29 DIAGNOSIS — R972 Elevated prostate specific antigen [PSA]: Secondary | ICD-10-CM | POA: Diagnosis not present

## 2020-12-05 DIAGNOSIS — N3000 Acute cystitis without hematuria: Secondary | ICD-10-CM | POA: Diagnosis not present

## 2020-12-05 DIAGNOSIS — R972 Elevated prostate specific antigen [PSA]: Secondary | ICD-10-CM | POA: Diagnosis not present

## 2020-12-05 DIAGNOSIS — N201 Calculus of ureter: Secondary | ICD-10-CM | POA: Diagnosis not present

## 2020-12-05 DIAGNOSIS — N5201 Erectile dysfunction due to arterial insufficiency: Secondary | ICD-10-CM | POA: Diagnosis not present

## 2021-02-02 ENCOUNTER — Telehealth: Payer: Self-pay | Admitting: Internal Medicine

## 2021-02-02 NOTE — Telephone Encounter (Signed)
Left message for patient to call me back at [phone] to schedule Medicare Annual Wellness Visit   Last AWV  03/27/16  Please schedule at anytime with [facility] if patient calls the office back.    40 Minutes appointment   Any questions, please call me at [phone]

## 2021-02-07 ENCOUNTER — Other Ambulatory Visit: Payer: Self-pay

## 2021-02-07 ENCOUNTER — Ambulatory Visit: Payer: Medicare Other | Admitting: Internal Medicine

## 2021-02-14 ENCOUNTER — Ambulatory Visit: Payer: Medicare Other

## 2021-04-08 ENCOUNTER — Telehealth: Payer: Medicare Other | Admitting: Emergency Medicine

## 2021-04-08 DIAGNOSIS — R051 Acute cough: Secondary | ICD-10-CM

## 2021-04-08 DIAGNOSIS — J019 Acute sinusitis, unspecified: Secondary | ICD-10-CM | POA: Diagnosis not present

## 2021-04-08 MED ORDER — BENZONATATE 100 MG PO CAPS: 100.0000 mg | ORAL_CAPSULE | Freq: Two times a day (BID) | ORAL | 0 refills | Status: DC | PRN

## 2021-04-08 MED ORDER — AMOXICILLIN-POT CLAVULANATE 875-125 MG PO TABS: 1.0000 | ORAL_TABLET | Freq: Two times a day (BID) | ORAL | 0 refills | Status: DC

## 2021-04-08 NOTE — Progress Notes (Signed)
Virtual Visit Consent   Jeffrey Rose, you are scheduled for a virtual visit with a [facility] provider today.     Just as with appointments in the office, your consent must be obtained to participate.  Your consent will be active for this visit and any virtual visit you may have with one of our providers in the next 365 days.     If you have a MyChart account, a copy of this consent can be sent to you electronically.  All virtual visits are billed to your insurance company just like a traditional visit in the office.    As this is a virtual visit, video technology does not allow for your provider to perform a traditional examination.  This may limit your provider's ability to fully assess your condition.  If your provider identifies any concerns that need to be evaluated in person or the need to arrange testing (such as labs, EKG, etc.), we will make arrangements to do so.     Although advances in technology are sophisticated, we cannot ensure that it will always work on either your end or our end.  If the connection with a video visit is poor, the visit may have to be switched to a telephone visit.  With either a video or telephone visit, we are not always able to ensure that we have a secure connection.     I need to obtain your verbal consent now.   Are you willing to proceed with your visit today? Yes   Jeffrey Rose has provided verbal consent on 04/08/2021 for a virtual visit (video or telephone).   Jeffrey Rose, [place]   Date: 04/08/2021 10:29 AM   Virtual Visit via Video Note   I, Jeffrey Rose, connected with  Jeffrey Rose  ([id number], 1950-11-26) on 04/08/21 at 10:30 AM EST by a video-enabled telemedicine application and verified that I am speaking with the correct person using two identifiers.  Location: Patient: Virtual Visit Location Patient: Home Provider: Virtual Visit Location Provider: Home Office   I discussed the limitations of evaluation and management by  telemedicine and the availability of in person appointments. The patient expressed understanding and agreed to proceed.    History of Present Illness: Jeffrey Rose is a 70 y.o. who identifies as a male who was assigned male at birth, and is being seen today for cough and congestion x few weeks.  Worse over the past few days.  Reports sinus congestion and productive cough now.  Reports positive covid test a few weeks ago.  Has tried OTC medications without relief.  Denies aggravating factor.  Denies previous symptoms in the past.   Complains of associated sinus congestion.  Denies fever, chills, sore throat, SOB, wheezing, chest pain, nausea, changes in bowel or bladder habits.     ROS: As per HPI.  All other pertinent ROS negative.     HPI: HPI  Problems:  Patient Active Problem List   Diagnosis Date Noted   Tendinopathy of gluteus medius 09/14/2020   Vitamin D deficiency 05/10/2020   Hyperglycemia 04/25/2017   Medicare annual wellness visit, initial 03/27/2016   Encounter for well adult exam with abnormal findings 03/27/2016   Essential hypertension 03/27/2016   Periodic health assessment, general screening, adult 01/27/2013   ELBOW PAIN, LEFT 08/31/2009   HIP PAIN, LEFT 08/31/2009   Hallux rigidus 08/31/2009   Mixed hyperlipidemia 06/19/2007   GASTROESOPHAGEAL REFLUX, NO ESOPHAGITIS 06/12/2006    Allergies: No Known Allergies Medications:  Current Outpatient Medications:    amoxicillin-clavulanate (AUGMENTIN) 875-125 MG tablet, Take 1 tablet by mouth 2 (two) times daily., Disp: 20 tablet, Rfl: 0   benzonatate (TESSALON) 100 MG capsule, Take 1 capsule (100 mg total) by mouth 2 (two) times daily as needed for cough., Disp: 20 capsule, Rfl: 0   aspirin 81 MG tablet, Take 1 tablet (81 mg total) by mouth daily., Disp: 90 tablet, Rfl: 3   Cholecalciferol (VITAMIN D-3 PO), Take 1 tablet by mouth daily., Disp: , Rfl:    HYDROcodone-acetaminophen (NORCO/VICODIN) 5-325 MG tablet, Take 1  tablet by mouth every 6 (six) hours as needed., Disp: 12 tablet, Rfl: 0   nitroGLYCERIN (NITRODUR - DOSED IN MG/24 HR) 0.2 mg/hr patch, Use 1/4 patch daily to the affected area., Disp: 30 patch, Rfl: 1   ondansetron (ZOFRAN ODT) 8 MG disintegrating tablet, Take 1 tablet (8 mg total) by mouth every 8 (eight) hours as needed for nausea or vomiting., Disp: 20 tablet, Rfl: 0   rosuvastatin (CRESTOR) 40 MG tablet, Take 1 tablet (40 mg total) by mouth daily., Disp: 90 tablet, Rfl: 3   tamsulosin (FLOMAX) 0.4 MG CAPS capsule, Take 1 capsule (0.4 mg total) by mouth daily., Disp: 7 capsule, Rfl: 0  Observations/Objective: Patient is well-developed, well-nourished in no acute distress.  Resting comfortably at home. Mildly fatigued Head is normocephalic, atraumatic.  No labored breathing. Speaking in full sentences, and tolerating own secretions Speech is clear and coherent with logical content.  Patient is alert and oriented at baseline.    Assessment and Plan: 1. Acute non-recurrent sinusitis, unspecified location  2. Acute cough  Get plenty of rest and push fluids Augmentin prescribed for possible sinus infection Tessalon perles for cough Use zyrtec for nasal congestion, runny nose, and/or sore throat Use flonase for nasal congestion and runny nose Use medications daily for symptom relief Use OTC medications like ibuprofen or tylenol as needed fever or pain Follow up with PCP in 1-2 days for recheck Go in person to urgent care, Call or go to the ED if you have any new or worsening symptoms such as fever, worsening cough, shortness of breath, chest tightness, chest pain, turning blue, changes in mental status, etc...    Follow Up Instructions: I discussed the assessment and treatment plan with the patient. The patient was provided an opportunity to ask questions and all were answered. The patient agreed with the plan and demonstrated an understanding of the instructions.  A copy of instructions  were sent to the patient via MyChart unless otherwise noted below.    The patient was advised to call back or seek an in-person evaluation if the symptoms worsen or if the condition fails to improve as anticipated.  Time:  I spent 5-10 minutes with the patient via telehealth technology discussing the above problems/concerns.    Jeffrey Box, PA-C

## 2021-04-08 NOTE — Patient Instructions (Signed)
Jeffrey Rose, thank you for joining [name], PA-C for today's virtual visit.  While this provider is not your primary care provider (PCP), if your PCP is located in our provider database this encounter information will be shared with them immediately following your visit.  Consent: (Patient) Jeffrey Rose provided verbal consent for this virtual visit at the beginning of the encounter.  Current Medications:  Current Outpatient Medications:    amoxicillin-clavulanate (AUGMENTIN) 875-125 MG tablet, Take 1 tablet by mouth 2 (two) times daily., Disp: 20 tablet, Rfl: 0   benzonatate (TESSALON) 100 MG capsule, Take 1 capsule (100 mg total) by mouth 2 (two) times daily as needed for cough., Disp: 20 capsule, Rfl: 0   aspirin 81 MG tablet, Take 1 tablet (81 mg total) by mouth daily., Disp: 90 tablet, Rfl: 3   Cholecalciferol (VITAMIN D-3 PO), Take 1 tablet by mouth daily., Disp: , Rfl:    HYDROcodone-acetaminophen (NORCO/VICODIN) 5-325 MG tablet, Take 1 tablet by mouth every 6 (six) hours as needed., Disp: 12 tablet, Rfl: 0   nitroGLYCERIN (NITRODUR - DOSED IN MG/24 HR) 0.2 mg/hr patch, Use 1/4 patch daily to the affected area., Disp: 30 patch, Rfl: 1   ondansetron (ZOFRAN ODT) 8 MG disintegrating tablet, Take 1 tablet (8 mg total) by mouth every 8 (eight) hours as needed for nausea or vomiting., Disp: 20 tablet, Rfl: 0   rosuvastatin (CRESTOR) 40 MG tablet, Take 1 tablet (40 mg total) by mouth daily., Disp: 90 tablet, Rfl: 3   tamsulosin (FLOMAX) 0.4 MG CAPS capsule, Take 1 capsule (0.4 mg total) by mouth daily., Disp: 7 capsule, Rfl: 0   Medications ordered in this encounter:  Meds ordered this encounter  Medications   amoxicillin-clavulanate (AUGMENTIN) 875-125 MG tablet    Sig: Take 1 tablet by mouth 2 (two) times daily.    Dispense:  20 tablet    Refill:  0    Order Specific Question:   Supervising Provider    Answer:   MILLER, BRIAN [[id number]]   benzonatate (TESSALON) 100 MG  capsule    Sig: Take 1 capsule (100 mg total) by mouth 2 (two) times daily as needed for cough.    Dispense:  20 capsule    Refill:  0    Order Specific Question:   Supervising Provider    Answer:   Sabra Heck, [place]     *If you need refills on other medications prior to your next appointment, please contact your pharmacy*  Follow-Up: Call back or seek an in-person evaluation if the symptoms worsen or if the condition fails to improve as anticipated.  Other Instructions Get plenty of rest and push fluids Augmentin prescribed for possible sinus infection Tessalon perles for cough Use zyrtec for nasal congestion, runny nose, and/or sore throat Use flonase for nasal congestion and runny nose Use medications daily for symptom relief Use OTC medications like ibuprofen or tylenol as needed fever or pain Follow up with PCP in 1-2 days for recheck Go in person to urgent care, Call or go to the ED if you have any new or worsening symptoms such as fever, worsening cough, shortness of breath, chest tightness, chest pain, turning blue, changes in mental status, etc...     If you have been instructed to have an in-person evaluation today at a local Urgent Care facility, please use the link below. It will take you to a list of all of our available [facility], including address, phone number and  hours of operation. Please do not delay care.  [facility]  If you or a family member do not have a primary care provider, use the link below to schedule a visit and establish care. When you choose a [facility] primary care physician or advanced practice provider, you gain a long-term partner in health. Find a Primary Care Provider  Learn more about 's in-office and virtual care options: [facility]

## 2021-05-09 ENCOUNTER — Other Ambulatory Visit (INDEPENDENT_AMBULATORY_CARE_PROVIDER_SITE_OTHER): Payer: Medicare Other

## 2021-05-09 ENCOUNTER — Other Ambulatory Visit: Payer: Self-pay

## 2021-05-09 DIAGNOSIS — Z125 Encounter for screening for malignant neoplasm of prostate: Secondary | ICD-10-CM | POA: Diagnosis not present

## 2021-05-09 DIAGNOSIS — E782 Mixed hyperlipidemia: Secondary | ICD-10-CM | POA: Diagnosis not present

## 2021-05-09 DIAGNOSIS — R739 Hyperglycemia, unspecified: Secondary | ICD-10-CM | POA: Diagnosis not present

## 2021-05-09 DIAGNOSIS — E559 Vitamin D deficiency, unspecified: Secondary | ICD-10-CM

## 2021-05-09 DIAGNOSIS — Z0001 Encounter for general adult medical examination with abnormal findings: Secondary | ICD-10-CM

## 2021-05-09 LAB — URINALYSIS, ROUTINE W REFLEX MICROSCOPIC
Bilirubin Urine: NEGATIVE
Hgb urine dipstick: NEGATIVE
Ketones, ur: NEGATIVE
Leukocytes,Ua: NEGATIVE
Nitrite: NEGATIVE
RBC / HPF: NONE SEEN (ref 0–?)
Specific Gravity, Urine: 1.02 (ref 1.000–1.030)
Total Protein, Urine: NEGATIVE
Urine Glucose: NEGATIVE
Urobilinogen, UA: 0.2 (ref 0.0–1.0)
WBC, UA: NONE SEEN (ref 0–?)
pH: 6 (ref 5.0–8.0)

## 2021-05-09 LAB — BASIC METABOLIC PANEL
BUN: 15 mg/dL (ref 6–23)
CO2: 30 mEq/L (ref 19–32)
Calcium: 9.3 mg/dL (ref 8.4–10.5)
Chloride: 102 mEq/L (ref 96–112)
Creatinine, Ser: 0.91 mg/dL (ref 0.40–1.50)
GFR: 85.64 mL/min (ref >60.00)
Glucose, Bld: 131 mg/dL — ABNORMAL HIGH (ref 70–99)
Potassium: 4.4 mEq/L (ref 3.5–5.1)
Sodium: 140 mEq/L (ref 135–145)

## 2021-05-09 LAB — CBC WITH DIFFERENTIAL/PLATELET
Basophils Absolute: 0 10*3/uL (ref 0.0–0.1)
Basophils Relative: 0.6 % (ref 0.0–3.0)
Eosinophils Absolute: 0.2 10*3/uL (ref 0.0–0.7)
Eosinophils Relative: 5.1 % — ABNORMAL HIGH (ref 0.0–5.0)
HCT: 43.8 % (ref 39.0–52.0)
Hemoglobin: 14.3 g/dL (ref 13.0–17.0)
Lymphocytes Relative: 37 % (ref 12.0–46.0)
Lymphs Abs: 1.7 10*3/uL (ref 0.7–4.0)
MCHC: 32.6 g/dL (ref 30.0–36.0)
MCV: 92.6 fl (ref 78.0–100.0)
Monocytes Absolute: 0.6 10*3/uL (ref 0.1–1.0)
Monocytes Relative: 13.3 % — ABNORMAL HIGH (ref 3.0–12.0)
Neutro Abs: 2.1 10*3/uL (ref 1.4–7.7)
Neutrophils Relative %: 44 % (ref 43.0–77.0)
Platelets: 202 10*3/uL (ref 150.0–400.0)
RBC: 4.73 Mil/uL (ref 4.22–5.81)
RDW: 12.5 % (ref 11.5–15.5)
WBC: 4.7 10*3/uL (ref 4.0–10.5)

## 2021-05-09 LAB — HEPATIC FUNCTION PANEL
ALT: 41 U/L (ref 0–53)
AST: 28 U/L (ref 0–37)
Albumin: 4.2 g/dL (ref 3.5–5.2)
Alkaline Phosphatase: 71 U/L (ref 39–117)
Bilirubin, Direct: 0.2 mg/dL (ref 0.0–0.3)
Total Bilirubin: 1.2 mg/dL (ref 0.2–1.2)
Total Protein: 7.2 g/dL (ref 6.0–8.3)

## 2021-05-09 LAB — LIPID PANEL
Cholesterol: 170 mg/dL (ref 0–200)
HDL: 50.6 mg/dL (ref >39.00)
LDL Cholesterol: 80 mg/dL (ref 0–99)
NonHDL: 119.6
Total CHOL/HDL Ratio: 3
Triglycerides: 198 mg/dL — ABNORMAL HIGH (ref 0.0–149.0)
VLDL: 39.6 mg/dL (ref 0.0–40.0)

## 2021-05-09 LAB — HEMOGLOBIN A1C: Hgb A1c MFr Bld: 6.3 % (ref 4.6–6.5)

## 2021-05-09 LAB — PSA: PSA: 1.18 ng/mL (ref 0.10–4.00)

## 2021-05-09 LAB — VITAMIN D 25 HYDROXY (VIT D DEFICIENCY, FRACTURES): VITD: 49.98 ng/mL (ref 30.00–100.00)

## 2021-05-09 LAB — TSH: TSH: 2.44 u[IU]/mL (ref 0.35–5.50)

## 2021-05-16 ENCOUNTER — Ambulatory Visit (INDEPENDENT_AMBULATORY_CARE_PROVIDER_SITE_OTHER): Payer: Medicare Other | Admitting: Internal Medicine

## 2021-05-16 ENCOUNTER — Other Ambulatory Visit: Payer: Self-pay

## 2021-05-16 ENCOUNTER — Encounter: Payer: Self-pay | Admitting: Internal Medicine

## 2021-05-16 VITALS — BP 128/82 | HR 67 | Temp 97.6°F | Ht 70.0 in | Wt 190.4 lb

## 2021-05-16 DIAGNOSIS — R739 Hyperglycemia, unspecified: Secondary | ICD-10-CM

## 2021-05-16 DIAGNOSIS — E538 Deficiency of other specified B group vitamins: Secondary | ICD-10-CM

## 2021-05-16 DIAGNOSIS — I10 Essential (primary) hypertension: Secondary | ICD-10-CM

## 2021-05-16 DIAGNOSIS — E782 Mixed hyperlipidemia: Secondary | ICD-10-CM

## 2021-05-16 DIAGNOSIS — E559 Vitamin D deficiency, unspecified: Secondary | ICD-10-CM | POA: Diagnosis not present

## 2021-05-16 DIAGNOSIS — Z86010 Personal history of colonic polyps: Secondary | ICD-10-CM

## 2021-05-16 DIAGNOSIS — Z0001 Encounter for general adult medical examination with abnormal findings: Secondary | ICD-10-CM

## 2021-05-16 DIAGNOSIS — Z0000 Encounter for general adult medical examination without abnormal findings: Secondary | ICD-10-CM | POA: Diagnosis not present

## 2021-05-16 MED ORDER — ROSUVASTATIN CALCIUM 40 MG PO TABS: 40.0000 mg | ORAL_TABLET | Freq: Every day | ORAL | 3 refills | Status: DC

## 2021-05-16 NOTE — Patient Instructions (Signed)
Please continue all other medications as before, and refills have been done if requested.  Please have the pharmacy call with any other refills you may need.  Please continue your efforts at being more active, low cholesterol diet, and weight control.  You are otherwise up to date with prevention measures today.  Please keep your appointments with your specialists as you may have planned  You will be contacted regarding the referral for: colonoscopy  Remember to get the shingles shot #2 as you mentioned  Please make an Appointment to return for your 1 year visit, or sooner if needed, with Lab testing by Appointment as well, to be done about 3-5 days before at the [facility] (so this is for TWO appointments - please see the scheduling desk as you leave)   Due to the ongoing Covid 19 pandemic, our lab now requires an appointment for any labs done at our office.  If you need labs done and do not have an appointment, please call our office ahead of time to schedule before presenting to the lab for your testing.

## 2021-05-16 NOTE — Progress Notes (Signed)
Patient ID: Jeffrey Rose, male   DOB: 22-May-1950, 71 y.o.   MRN: [id number]         Chief Complaint:: wellness exam and htn, hyperglycemia, hld, low vit d       HPI:  Jeffrey Rose is a 71 y.o. male here for wellness exam; declines covid booster, shingrix o/w up to date, except due colonoscopy                        Also Pt denies chest pain, increased sob or doe, wheezing, orthopnea, PND, increased LE swelling, palpitations, dizziness or syncope.   Pt denies polydipsia, polyuria, or new focal neuro s/s.   Pt denies fever, wt loss, night sweats, loss of appetite, or other constitutional symptoms  Taking Vit D.  No other new complaints      Wt Readings from Last 3 Encounters:  05/16/21 190 lb 6.4 oz (86.4 kg)  09/14/20 185 lb (83.9 kg)  06/19/20 180 lb (81.6 kg)   BP Readings from Last 3 Encounters:  05/16/21 128/82  09/14/20 126/70  06/19/20 131/81   Immunization History  Administered Date(s) Administered   Influenza Split 01/27/2013   Influenza, High Dose Seasonal PF 02/24/2018   Influenza-Unspecified 02/13/2017, 02/27/2018, 02/06/2020, 02/11/2021   Moderna SARS-COV2 Booster Vaccination 02/06/2020   Moderna Sars-Covid-2 Vaccination 05/20/2019, 06/15/2019   Pneumococcal Conjugate-13 05/05/2019   Pneumococcal Polysaccharide-23 04/25/2017   Tdap 03/27/2016   There are no preventive care reminders to display for this patient.     Past Medical History:  Diagnosis Date   Arthritis    Hyperlipidemia    Hypertension    Urinary tract infection    Past Surgical History:  Procedure Laterality Date   BUNIONECTOMY Bilateral    oral growth removed     oral growth removed from mouth.    reports that he has quit smoking. He has never used smokeless tobacco. He reports current alcohol use of about 2.0 - 3.0 standard drinks per week. He reports that he does not use drugs. family history includes Diabetes in his mother; Emphysema in his father; Heart disease in his father; Kidney  disease in his mother. No Known Allergies Current Outpatient Medications on File Prior to Visit  Medication Sig Dispense Refill   aspirin 81 MG tablet Take 1 tablet (81 mg total) by mouth daily. 90 tablet 3   Cholecalciferol (VITAMIN D-3 PO) Take 1 tablet by mouth daily.     nitroGLYCERIN (NITRODUR - DOSED IN MG/24 HR) 0.2 mg/hr patch Use 1/4 patch daily to the affected area. 30 patch 1   amoxicillin-clavulanate (AUGMENTIN) 875-125 MG tablet Take 1 tablet by mouth 2 (two) times daily. 20 tablet 0   benzonatate (TESSALON) 100 MG capsule Take 1 capsule (100 mg total) by mouth 2 (two) times daily as needed for cough. 20 capsule 0   HYDROcodone-acetaminophen (NORCO/VICODIN) 5-325 MG tablet Take 1 tablet by mouth every 6 (six) hours as needed. 12 tablet 0   ondansetron (ZOFRAN ODT) 8 MG disintegrating tablet Take 1 tablet (8 mg total) by mouth every 8 (eight) hours as needed for nausea or vomiting. 20 tablet 0   tamsulosin (FLOMAX) 0.4 MG CAPS capsule Take 1 capsule (0.4 mg total) by mouth daily. 7 capsule 0   No current facility-administered medications on file prior to visit.        ROS:  All others reviewed and negative.  Objective        PE:  BP  128/82    Pulse 67    Temp 97.6 F (36.4 C) (Oral)    Ht 5\' 10"  (1.778 m)    Wt 190 lb 6.4 oz (86.4 kg)    SpO2 98%    BMI 27.32 kg/m                 Constitutional: Pt appears in NAD               HENT: Head: NCAT.                Right Ear: External ear normal.                 Left Ear: External ear normal.                Eyes: . Pupils are equal, round, and reactive to light. Conjunctivae and EOM are normal               Nose: without d/c or deformity               Neck: Neck supple. Gross normal ROM               Cardiovascular: Normal rate and regular rhythm.                 Pulmonary/Chest: Effort normal and breath sounds without rales or wheezing.                Abd:  Soft, NT, ND, + BS, no organomegaly               Neurological: Pt is  alert. At baseline orientation, motor grossly intact               Skin: Skin is warm. No rashes, no other new lesions, LE edema - none               Psychiatric: Pt behavior is normal without agitation   Micro: none  Cardiac tracings I have personally interpreted today:  none  Pertinent Radiological findings (summarize): none   Lab Results  Component Value Date   WBC 4.7 05/09/2021   HGB 14.3 05/09/2021   HCT 43.8 05/09/2021   PLT 202.0 05/09/2021   GLUCOSE 131 (H) 05/09/2021   CHOL 170 05/09/2021   TRIG 198.0 (H) 05/09/2021   HDL 50.60 05/09/2021   LDLDIRECT 101.0 05/05/2019   LDLCALC 80 05/09/2021   ALT 41 05/09/2021   AST 28 05/09/2021   NA 140 05/09/2021   K 4.4 05/09/2021   CL 102 05/09/2021   CREATININE 0.91 05/09/2021   BUN 15 05/09/2021   CO2 30 05/09/2021   TSH 2.44 05/09/2021   PSA 1.18 05/09/2021   HGBA1C 6.3 05/09/2021   Assessment/Plan:  Jeffrey Rose is a 71 y.o. White or Caucasian [1] male with  has a past medical history of Arthritis, Hyperlipidemia, Hypertension, and Urinary tract infection.  Mixed hyperlipidemia Lab Results  Component Value Date   LDLCALC 80 05/09/2021   Stable, pt to continue current statin crestor 40   Vitamin D deficiency Last vitamin D Lab Results  Component Value Date   VD25OH 49.98 05/09/2021   Stable, cont oral replacement   Encounter for well adult exam with abnormal findings Age and sex appropriate education and counseling updated with regular exercise and diet Referrals for preventative services - for colonoscopy Immunizations addressed - declines covid booster, shingrix Smoking counseling  - none needed Evidence for depression or other mood  disorder - none significant Most recent labs reviewed. I have personally reviewed and have noted: 1) the patient's medical and social history 2) The patient's current medications and supplements 3) The patient's height, weight, and BMI have been recorded in the  chart   Essential hypertension BP Readings from Last 3 Encounters:  05/16/21 128/82  09/14/20 126/70  06/19/20 131/81   Stable, pt to continue medical treatment  - diet, low salt, wt control   Hyperglycemia Lab Results  Component Value Date   HGBA1C 6.3 05/09/2021   Stable, pt to continue current medical treatment  - diet, wt control   History of colon polyps For colonoscopy Followup: Return in about 1 year (around 05/16/2022).  Cathlean Cower, MD 05/20/2021 3:39 PM [facility]

## 2021-05-16 NOTE — Assessment & Plan Note (Signed)
Lab Results  Component Value Date   LDLCALC 80 05/09/2021   Stable, pt to continue current statin crestor 40

## 2021-05-16 NOTE — Assessment & Plan Note (Signed)
Last vitamin D Lab Results  Component Value Date   VD25OH 49.98 05/09/2021   Stable, cont oral replacement  

## 2021-05-18 ENCOUNTER — Encounter: Payer: Self-pay | Admitting: Gastroenterology

## 2021-05-20 ENCOUNTER — Encounter: Payer: Self-pay | Admitting: Internal Medicine

## 2021-05-20 DIAGNOSIS — Z860100 Personal history of colon polyps, unspecified: Secondary | ICD-10-CM | POA: Insufficient documentation

## 2021-05-20 NOTE — Assessment & Plan Note (Addendum)
Age and sex appropriate education and counseling updated with regular exercise and diet Referrals for preventative services - for colonoscopy Immunizations addressed - declines covid booster, shingrix Smoking counseling  - none needed Evidence for depression or other mood disorder - none significant Most recent labs reviewed. I have personally reviewed and have noted: 1) the patient's medical and social history 2) The patient's current medications and supplements 3) The patient's height, weight, and BMI have been recorded in the chart

## 2021-05-20 NOTE — Assessment & Plan Note (Signed)
Lab Results  Component Value Date   HGBA1C 6.3 05/09/2021   Stable, pt to continue current medical treatment  - diet, wt control

## 2021-05-20 NOTE — Assessment & Plan Note (Signed)
For colonoscopy 

## 2021-05-20 NOTE — Assessment & Plan Note (Signed)
BP Readings from Last 3 Encounters:  05/16/21 128/82  09/14/20 126/70  06/19/20 131/81   Stable, pt to continue medical treatment  - diet, low salt, wt control

## 2021-06-19 ENCOUNTER — Ambulatory Visit (AMBULATORY_SURGERY_CENTER): Payer: Medicare Other

## 2021-06-19 ENCOUNTER — Other Ambulatory Visit: Payer: Self-pay

## 2021-06-19 VITALS — Ht 70.0 in | Wt 180.0 lb

## 2021-06-19 DIAGNOSIS — Z86010 Personal history of colonic polyps: Secondary | ICD-10-CM

## 2021-06-19 MED ORDER — PEG 3350-KCL-NA BICARB-NACL 420 G PO SOLR: 4000.0000 mL | Freq: Once | ORAL | 0 refills | Status: AC

## 2021-06-19 NOTE — Progress Notes (Signed)

## 2021-06-21 ENCOUNTER — Encounter: Payer: Self-pay | Admitting: Gastroenterology

## 2021-07-03 ENCOUNTER — Other Ambulatory Visit: Payer: Self-pay

## 2021-07-03 ENCOUNTER — Ambulatory Visit (AMBULATORY_SURGERY_CENTER): Payer: Medicare Other | Admitting: Gastroenterology

## 2021-07-03 ENCOUNTER — Encounter: Payer: Self-pay | Admitting: Gastroenterology

## 2021-07-03 ENCOUNTER — Other Ambulatory Visit: Payer: Self-pay | Admitting: Gastroenterology

## 2021-07-03 VITALS — BP 116/65 | HR 60 | Temp 98.1°F | Resp 11 | Ht 70.0 in | Wt 180.0 lb

## 2021-07-03 DIAGNOSIS — Z86010 Personal history of colonic polyps: Secondary | ICD-10-CM | POA: Diagnosis not present

## 2021-07-03 DIAGNOSIS — D122 Benign neoplasm of ascending colon: Secondary | ICD-10-CM

## 2021-07-03 DIAGNOSIS — K635 Polyp of colon: Secondary | ICD-10-CM | POA: Diagnosis not present

## 2021-07-03 MED ORDER — SODIUM CHLORIDE 0.9 % IV SOLN: 500.0000 mL | Freq: Once | INTRAVENOUS | Status: DC

## 2021-07-03 NOTE — Op Note (Signed)
[facility] [facility] ?Patient Name: Jeffrey Rose ?Procedure Date: 07/03/2021 7:50 AM ?MRN: [id number] ?Endoscopist: Estill Cotta. Loletha Carrow , MD ?Age: 71 ?Referring MD:  ?Date of Birth: September 27, 1950 ?Gender: Male ?Account #: [id number] ?Procedure:                Colonoscopy ?Indications:              Surveillance: Personal history of adenomatous  ?                          polyps on last colonoscopy > 5 years ago ?                          <56m rectal polyp 06/2016 (no polyps 2008) ?Medicines:                Monitored Anesthesia Care ?Procedure:                Pre-Anesthesia Assessment: ?                          - Prior to the procedure, a History and Physical  ?                          was performed, and patient medications and  ?                          allergies were reviewed. The patient's tolerance of  ?                          previous anesthesia was also reviewed. The risks  ?                          and benefits of the procedure and the sedation  ?                          options and risks were discussed with the patient.  ?                          All questions were answered, and informed consent  ?                          was obtained. Prior Anticoagulants: The patient has  ?                          taken no previous anticoagulant or antiplatelet  ?                          agents. ASA Grade Assessment: II - A patient with  ?                          mild systemic disease. After reviewing the risks  ?                          and benefits, the patient was deemed in  ?  satisfactory condition to undergo the procedure. ?                          After obtaining informed consent, the colonoscope  ?                          was passed under direct vision. Throughout the  ?                          procedure, the patient's blood pressure, pulse, and  ?                          oxygen saturations were monitored continuously. The  ?                          CF [id number] #[id number] was  introduced through the anus  ?                          and advanced to the the cecum, identified by  ?                          appendiceal orifice and ileocecal valve. The  ?                          colonoscopy was performed without difficulty. The  ?                          patient tolerated the procedure well. The quality  ?                          of the bowel preparation was excellent. The  ?                          ileocecal valve, appendiceal orifice, and rectum  ?                          were photographed. The bowel preparation used was  ?                          GoLYTELY. ?Scope In: 7:58:49 AM ?Scope Out: 8:12:08 AM ?Scope Withdrawal Time: 0 hours 9 minutes 5 seconds  ?Total Procedure Duration: 0 hours 13 minutes 19 seconds  ?Findings:                 The perianal and digital rectal examinations were  ?                          normal. ?                          Repeat examination of right colon under NBI  ?                          performed. ?  A diminutive polyp was found in the ascending  ?                          colon. The polyp was sessile. The polyp was removed  ?                          with a cold snare. Resection and retrieval were  ?                          complete. ?                          The exam was otherwise without abnormality on  ?                          direct and retroflexion views. ?Complications:            No immediate complications. ?Estimated Blood Loss:     Estimated blood loss was minimal. ?Impression:               - One diminutive polyp in the ascending colon,  ?                          removed with a cold snare. Resected and retrieved. ?                          - The examination was otherwise normal on direct  ?                          and retroflexion views. ?Recommendation:           - Patient has a contact number available for  ?                          emergencies. The signs and symptoms of potential  ?                           delayed complications were discussed with the  ?                          patient. Return to normal activities tomorrow.  ?                          Written discharge instructions were provided to the  ?                          patient. ?                          - Resume previous diet. ?                          - Continue present medications. ?                          - Await pathology results. ?                          -  Repeat colonoscopy is recommended for  ?                          surveillance. The colonoscopy date will be  ?                          determined after pathology results from today's  ?                          exam become available for review. ?Sophia Sperry L. Loletha Carrow, MD ?07/03/2021 8:23:13 AM ?This report has been signed electronically. ?

## 2021-07-03 NOTE — Progress Notes (Signed)
Pt non-responsive, VVS, Report to RN  °

## 2021-07-03 NOTE — Progress Notes (Signed)
History and Physical: ? This patient presents for endoscopic testing for: ?Encounter Diagnosis  ?Name Primary?  ? Personal history of colonic polyps Yes  ? ? ?Last colonoscopy March 2018 with a rectal polyp ?Patient denies chronic abdominal pain, rectal bleeding, constipation or diarrhea. ? ? ?ROS: ?Patient denies chest pain or shortness of breath ? ? ?Past Medical History: ?Past Medical History:  ?Diagnosis Date  ? Arthritis   ? Blood transfusion without reported diagnosis   ? Hyperlipidemia   ? Hypertension   ? Urinary tract infection   ? ? ? ?Past Surgical History: ?Past Surgical History:  ?Procedure Laterality Date  ? BUNIONECTOMY Bilateral   ? COLONOSCOPY    ? oral growth removed    ? oral growth removed from mouth.  ? ? ?Allergies: ?No Known Allergies ? ?Outpatient Meds: ?Current Outpatient Medications  ?Medication Sig Dispense Refill  ? aspirin 81 MG tablet Take 1 tablet (81 mg total) by mouth daily. 90 tablet 3  ? Cholecalciferol (VITAMIN D-3 PO) Take 1 tablet by mouth daily.    ? Glucosamine HCl (GLUCOSAMINE PO) Take by mouth.    ? Multiple Vitamins-Minerals (MULTIVITAL PO) Take by mouth.    ? nitroGLYCERIN (NITRODUR - DOSED IN MG/24 HR) 0.2 mg/hr patch Use 1/4 patch daily to the affected area. 30 patch 1  ? ondansetron (ZOFRAN ODT) 8 MG disintegrating tablet Take 1 tablet (8 mg total) by mouth every 8 (eight) hours as needed for nausea or vomiting. (Patient not taking: Reported on 06/19/2021) 20 tablet 0  ? rosuvastatin (CRESTOR) 40 MG tablet Take 1 tablet (40 mg total) by mouth daily. 90 tablet 3  ? ?Current Facility-Administered Medications  ?Medication Dose Route Frequency Provider Last Rate Last Admin  ? 0.9 %  sodium chloride infusion  500 mL Intravenous Once Doran Stabler, MD      ? ? ? ? ?___________________________________________________________________ ?Objective  ? ?Exam: ? ?BP (!) 157/95   Pulse 64   Temp 98.1 ?F (36.7 ?C)   Ht '5\' 10"'$  (1.778 m)   Wt 180 lb (81.6 kg)   SpO2 96%   BMI  25.83 kg/m?  ? ?CV: RRR without murmur, S1/S2 ?Resp: clear to auscultation bilaterally, normal RR and effort noted ?GI: soft, no tenderness, with active bowel sounds. ? ? ?Assessment: ?Encounter Diagnosis  ?Name Primary?  ? Personal history of colonic polyps Yes  ? ? ? ?Plan: ?Colonoscopy ? The benefits and risks of the planned procedure were described in detail with the patient or (when appropriate) their health care proxy.  Risks were outlined as including, but not limited to, bleeding, infection, perforation, adverse medication reaction leading to cardiac or pulmonary decompensation, pancreatitis (if ERCP).  The limitation of incomplete mucosal visualization was also discussed.  No guarantees or warranties were given. ? ? ? ?The patient is appropriate for an endoscopic procedure in the ambulatory setting. ? ? - Wilfrid Lund, MD ? ? ? ? ?

## 2021-07-03 NOTE — Patient Instructions (Signed)
Handouts Provided:  Polyps  YOU HAD AN ENDOSCOPIC PROCEDURE TODAY AT THE [facility]:   Refer to the procedure report that was given to you for any specific questions about what was found during the examination.  If the procedure report does not answer your questions, please call your gastroenterologist to clarify.  If you requested that your care partner not be given the details of your procedure findings, then the procedure report has been included in a sealed envelope for you to review at your convenience later.  YOU SHOULD EXPECT: Some feelings of bloating in the abdomen. Passage of more gas than usual.  Walking can help get rid of the air that was put into your GI tract during the procedure and reduce the bloating. If you had a lower endoscopy (such as a colonoscopy or flexible sigmoidoscopy) you may notice spotting of blood in your stool or on the toilet paper. If you underwent a bowel prep for your procedure, you may not have a normal bowel movement for a few days.  Please Note:  You might notice some irritation and congestion in your nose or some drainage.  This is from the oxygen used during your procedure.  There is no need for concern and it should clear up in a day or so.  SYMPTOMS TO REPORT IMMEDIATELY:   Following lower endoscopy (colonoscopy or flexible sigmoidoscopy):  Excessive amounts of blood in the stool  Significant tenderness or worsening of abdominal pains  Swelling of the abdomen that is new, acute  Fever of 100F or higher  For urgent or emergent issues, a gastroenterologist can be reached at any hour by calling [phone]. Do not use MyChart messaging for urgent concerns.    DIET:  We do recommend a small meal at first, but then you may proceed to your regular diet.  Drink plenty of fluids but you should avoid alcoholic beverages for 24 hours.  ACTIVITY:  You should plan to take it easy for the rest of today and you should NOT DRIVE or use heavy  machinery until tomorrow (because of the sedation medicines used during the test).    FOLLOW UP: Our staff will call the number listed on your records 48-72 hours following your procedure to check on you and address any questions or concerns that you may have regarding the information given to you following your procedure. If we do not reach you, we will leave a message.  We will attempt to reach you two times.  During this call, we will ask if you have developed any symptoms of COVID 19. If you develop any symptoms (ie: fever, flu-like symptoms, shortness of breath, cough etc.) before then, please call [phone].  If you test positive for Covid 19 in the 2 weeks post procedure, please call and report this information to us.    If any biopsies were taken you will be contacted by phone or by letter within the next 1-3 weeks.  Please call us at [phone] if you have not heard about the biopsies in 3 weeks.    SIGNATURES/CONFIDENTIALITY: You and/or your care partner have signed paperwork which will be entered into your electronic medical record.  These signatures attest to the fact that that the information above on your After Visit Summary has been reviewed and is understood.  Full responsibility of the confidentiality of this discharge information lies with you and/or your care-partner.  

## 2021-07-03 NOTE — Progress Notes (Signed)
Called to room to assist during endoscopic procedure.  Patient ID and intended procedure confirmed with present staff. Received instructions for my participation in the procedure from the performing physician.  

## 2021-07-05 ENCOUNTER — Telehealth: Payer: Self-pay

## 2021-07-05 NOTE — Telephone Encounter (Signed)
?  Follow up Call- ? ? ?  07/03/2021  ?  7:10 AM  ?Call back number  ?Post procedure Call Back phone  # [phone]  ?Permission to leave phone message Yes  ?  ? ?Patient questions: ? ?Do you have a fever, pain , or abdominal swelling? No. ?Pain Score  0 * ? ?Have you tolerated food without any problems? No. ? ?Have you been able to return to your normal activities? Yes.   ? ?Do you have any questions about your discharge instructions: ?Diet   No. ?Medications  No. ?Follow up visit  No. ? ?Do you have questions or concerns about your Care? No. ? ?Actions: ?* If pain score is 4 or above: ?No action needed, pain <4. ? ? ?

## 2021-07-09 ENCOUNTER — Encounter: Payer: Self-pay | Admitting: Gastroenterology

## 2021-08-30 ENCOUNTER — Encounter (HOSPITAL_COMMUNITY): Payer: Self-pay | Admitting: Oncology

## 2021-08-30 ENCOUNTER — Emergency Department (HOSPITAL_COMMUNITY)
Admission: EM | Admit: 2021-08-30 | Discharge: 2021-08-30 | Disposition: A | Discharge status: Home / Self Care | Payer: Medicare Other | Attending: Emergency Medicine | Admitting: Emergency Medicine

## 2021-08-30 ENCOUNTER — Ambulatory Visit
Admission: EM | Admit: 2021-08-30 | Discharge: 2021-08-30 | Disposition: A | Discharge status: Home / Self Care | Payer: Medicare Other | Attending: Family Medicine | Admitting: Family Medicine

## 2021-08-30 ENCOUNTER — Other Ambulatory Visit: Payer: Self-pay

## 2021-08-30 DIAGNOSIS — Z7982 Long term (current) use of aspirin: Secondary | ICD-10-CM | POA: Diagnosis not present

## 2021-08-30 DIAGNOSIS — R040 Epistaxis: Secondary | ICD-10-CM

## 2021-08-30 MED ORDER — OXYMETAZOLINE HCL 0.05 % NA SOLN
1.0000 | Freq: Once | NASAL | Status: AC
  Administered 2021-08-30: 1 via NASAL
  Filled 2021-08-30: qty 30

## 2021-08-30 MED ORDER — TRANEXAMIC ACID FOR EPISTAXIS
500.0000 mg | Freq: Once | TOPICAL | Status: DC
  Filled 2021-08-30 (×2): qty 10

## 2021-08-30 NOTE — Discharge Instructions (Addendum)
He will proceed to the ER for definitive treatment

## 2021-08-30 NOTE — ED Triage Notes (Signed)
Pt presents d/t nose bleed x 2.5 hours. Pt has been holding pressure w/o cessation. Denies anticoagulants, endorses 81 mg ASA daily.

## 2021-08-30 NOTE — ED Notes (Signed)
Patient is being discharged from the Urgent Care and sent to the Emergency Department via car . Per Dr. Windy Carina, patient is in need of higher level of care due to excessive nosebleed. Patient is aware and verbalizes understanding of plan of care.  Vitals:   08/30/21 1332  BP: (!) 153/94  Pulse: 99  Resp: 18  Temp: 98 F (36.7 C)  SpO2: 92%

## 2021-08-30 NOTE — ED Provider Notes (Signed)
[facility] DEPT Provider Note   CSN: [id number] Arrival date & time: 08/30/21  1402     History  Chief Complaint  Patient presents with   Epistaxis    Jeffrey Rose is a 71 y.o. male.  HPI Patient had spontaneous nosebleeding that started today, on and off and worsened around noon.  He went to an urgent care who directed him to come here.  No prior similar problems.  He has allergy symptoms with puffy eyes, recently, but no nasal congestion or sinus drainage.  He denies fever or chills.    Home Medications Prior to Admission medications   Medication Sig Start Date End Date Taking? Authorizing Provider  aspirin 81 MG tablet Take 1 tablet (81 mg total) by mouth daily. 04/25/17   Biagio Borg, MD  Cholecalciferol (VITAMIN D-3 PO) Take 1 tablet by mouth daily.    [provider]  Glucosamine HCl (GLUCOSAMINE PO) Take by mouth.    [provider]  Multiple Vitamins-Minerals (MULTIVITAL PO) Take by mouth.    [provider]  nitroGLYCERIN (NITRODUR - DOSED IN MG/24 HR) 0.2 mg/hr patch Use 1/4 patch daily to the affected area. 09/14/20   Thurman Coyer, DO  rosuvastatin (CRESTOR) 40 MG tablet Take 1 tablet (40 mg total) by mouth daily. 05/16/21   Biagio Borg, MD      Allergies    Patient has no known allergies.    Review of Systems   Review of Systems  Physical Exam Updated Vital Signs BP (!) 165/82 (BP Location: Left Arm)   Pulse 76   Temp 98.5 F (36.9 C) (Oral)   Resp 18   SpO2 100%  Physical Exam Vitals and nursing note reviewed.  Constitutional:      General: He is not in acute distress.    Appearance: He is well-developed. He is not ill-appearing or diaphoretic.  HENT:     Head: Normocephalic and atraumatic.     Right Ear: External ear normal.     Left Ear: External ear normal.     Nose:     Comments: No active bleeding or clot in either nares.    Mouth/Throat:     Comments: Blood in oropharynx, not  actively bleeding.  Consistent with recent bleed. Eyes:     Conjunctiva/sclera: Conjunctivae normal.     Pupils: Pupils are equal, round, and reactive to light.  Neck:     Trachea: Phonation normal.  Cardiovascular:     Rate and Rhythm: Normal rate.  Pulmonary:     Effort: Pulmonary effort is normal.  Abdominal:     General: There is no distension.  Musculoskeletal:        General: Normal range of motion.     Cervical back: Normal range of motion.  Skin:    General: Skin is warm and dry.  Neurological:     Mental Status: He is alert and oriented to person, place, and time.     Cranial Nerves: No cranial nerve deficit.     Sensory: No sensory deficit.     Motor: No abnormal muscle tone.     Coordination: Coordination normal.  Psychiatric:        Mood and Affect: Mood normal.        Behavior: Behavior normal.        Thought Content: Thought content normal.        Judgment: Judgment normal.    ED Results / Procedures / Treatments  Labs (all labs ordered are listed, but only abnormal results are displayed) Labs Reviewed - No data to display  EKG None  Radiology No results found.  Procedures Procedures    Medications Ordered in ED Medications  oxymetazoline (AFRIN) 0.05 % nasal spray 1 spray (has no administration in time range)  tranexamic acid (CYKLOKAPRON) 1000 MG/10ML topical solution 500 mg (has no administration in time range)    ED Course/ Medical Decision Making/ A&P Clinical Course as of 08/30/21 1612  Thu Aug 30, 2021  1611 No recurrence of nosebleeding.  I instilled 2 spray of oxymetazoline, into each nostril. [EW]    Clinical Course User Index [EW] Daleen Bo, MD                           Medical Decision Making Spontaneous nosebleeding without trauma or acute illness.  Problems Addressed: Epistaxis: self-limited or minor problem    Details: Bleeding stopped spontaneously with pressure on the nose.  Amount and/or Complexity of Data  Reviewed Independent Historian:     Details: Cogent historian, wife gives a few additional details  Risk Decision regarding hospitalization. Risk Details: Patient with spontaneous nosebleeding, presenting bleeding, which was treated with pressure on the anterior nose.  At the time I saw him he is not actively bleeding.  He was observed for 45 minutes and did not rebleed.  Afrin nasal spray was instilled to help prevent further episodes of bleeding.  Counseled on treatment for further episodes of nosebleeding and further treatment at home.  Patient does have some environmental allergies and takes Claritin chronically.  At this point the most important thing is cessation further bleeding, and hopefully Afrin will do that.  He could follow-up with ENT or PCP for further care and treatment as needed.  Do not think he needs additional allergy medications or ED intervention at this time.  No indication for hospitalization.           Final Clinical Impression(s) / ED Diagnoses Final diagnoses:  None    Rx / DC Orders ED Discharge Orders     None         Daleen Bo, MD 08/30/21 1614

## 2021-08-30 NOTE — ED Provider Notes (Signed)
[facility]    CSN: [id number] Arrival date & time: 08/30/21  1319      History   Chief Complaint Chief Complaint  Patient presents with   Epistaxis    HPI Jeffrey Rose is a 71 y.o. male.    Epistaxis Here for profuse nosebleed. Had it for a bit this AM, and it stopped. Then restarted again. Can't get it to stop.  Past Medical History:  Diagnosis Date   Arthritis    Blood transfusion without reported diagnosis    Hyperlipidemia    Hypertension    Urinary tract infection     Patient Active Problem List   Diagnosis Date Noted   History of colon polyps 05/20/2021   Tendinopathy of gluteus medius 09/14/2020   Vitamin D deficiency 05/10/2020   Hyperglycemia 04/25/2017   Medicare annual wellness visit, initial 03/27/2016   Encounter for well adult exam with abnormal findings 03/27/2016   Essential hypertension 03/27/2016   Periodic health assessment, general screening, adult 01/27/2013   ELBOW PAIN, LEFT 08/31/2009   HIP PAIN, LEFT 08/31/2009   Hallux rigidus 08/31/2009   Mixed hyperlipidemia 06/19/2007   GASTROESOPHAGEAL REFLUX, NO ESOPHAGITIS 06/12/2006    Past Surgical History:  Procedure Laterality Date   BUNIONECTOMY Bilateral    COLONOSCOPY     oral growth removed     oral growth removed from mouth.       Home Medications    Prior to Admission medications   Medication Sig Start Date End Date Taking? Authorizing Provider  aspirin 81 MG tablet Take 1 tablet (81 mg total) by mouth daily. 04/25/17   Biagio Borg, MD  Cholecalciferol (VITAMIN D-3 PO) Take 1 tablet by mouth daily.    [provider]  Glucosamine HCl (GLUCOSAMINE PO) Take by mouth.    [provider]  Multiple Vitamins-Minerals (MULTIVITAL PO) Take by mouth.    [provider]  nitroGLYCERIN (NITRODUR - DOSED IN MG/24 HR) 0.2 mg/hr patch Use 1/4 patch daily to the affected area. 09/14/20   Thurman Coyer, DO  rosuvastatin (CRESTOR) 40 MG tablet  Take 1 tablet (40 mg total) by mouth daily. 05/16/21   Biagio Borg, MD    Family History Family History  Problem Relation Age of Onset   Kidney disease Mother    Diabetes Mother    Emphysema Father    Heart disease Father    Colon cancer Neg Hx    Esophageal cancer Neg Hx    Rectal cancer Neg Hx    Stomach cancer Neg Hx    Colon polyps Neg Hx     Social History Social History   Tobacco Use   Smoking status: Former   Smokeless tobacco: Never   Tobacco comments:    smoke pipe, quit 20 years ago  Vaping Use   Vaping Use: Never used  Substance Use Topics   Alcohol use: Yes    Alcohol/week: 2.0 - 3.0 standard drinks    Types: 2 - 3 Glasses of wine per week    Comment: occasionally    Drug use: No     Allergies   Patient has no known allergies.   Review of Systems Review of Systems  HENT:  Positive for nosebleeds.     Physical Exam Triage Vital Signs ED Triage Vitals  Enc Vitals Group     BP 08/30/21 1332 (!) 153/94     Pulse Rate 08/30/21 1332 99     Resp 08/30/21 1332 18  Temp 08/30/21 1332 98 F (36.7 C)     Temp Source 08/30/21 1332 Oral     SpO2 08/30/21 1332 92 %     Weight --      Height --      Head Circumference --      Peak Flow --      Pain Score 08/30/21 1333 0     Pain Loc --      Pain Edu? --      Excl. in [place]? --    No data found.  Updated Vital Signs BP (!) 153/94 (BP Location: Left Arm)   Pulse 99   Temp 98 F (36.7 C) (Oral)   Resp 18   SpO2 92%   Visual Acuity Right Eye Distance:   Left Eye Distance:   Bilateral Distance:    Right Eye Near:   Left Eye Near:    Bilateral Near:     Physical Exam Vitals reviewed.  Constitutional:      General: He is not in acute distress.    Appearance: He is not toxic-appearing.     Comments: Holding gauze over his nose, with blood dripping down chin into bucket. Alert and conversant  Neurological:     Mental Status: He is oriented to person, place, and time.  Psychiatric:         Behavior: Behavior normal.     UC Treatments / Results  Labs (all labs ordered are listed, but only abnormal results are displayed) Labs Reviewed - No data to display  EKG   Radiology No results found.  Procedures Procedures (including critical care time)  Medications Ordered in UC Medications - No data to display  Initial Impression / Assessment and Plan / UC Course  I have reviewed the triage vital signs and the nursing notes.  Pertinent labs & imaging results that were available during my care of the patient were reviewed by me and considered in my medical decision making (see chart for details).     VS reassuring. Offered EMS transport due to the rapid bleeding. He and wife declined, stating they can get to the ER timely. Final Clinical Impressions(s) / UC Diagnoses   Final diagnoses:  Epistaxis     Discharge Instructions      He will proceed to the ER for definitive treatment    ED Prescriptions   None    PDMP not reviewed this encounter.   Barrett Henle, MD 08/30/21 1345

## 2021-08-30 NOTE — Discharge Instructions (Signed)
Use the nasal spray, 2 in each nostril, twice a day for 3 days.  If the nosebleeds again, use pressure on the soft part and hold it for up to 1 hour.  If you cannot stop the bleeding within an hour return here.  Avoid exertional activity today.  Make sure you are drinking plenty of fluids.  Gradually advance your diet tonight because swallowed blood can be irritating to the stomach.

## 2021-08-30 NOTE — ED Triage Notes (Signed)
Pt present excessive nose bleed today. Pt state he had one early this morning and it stop but as he was on the farmers tractor it began again.

## 2021-09-01 ENCOUNTER — Encounter: Payer: Self-pay | Admitting: Internal Medicine

## 2021-09-01 DIAGNOSIS — R040 Epistaxis: Secondary | ICD-10-CM

## 2021-09-03 ENCOUNTER — Encounter (HOSPITAL_COMMUNITY): Payer: Self-pay

## 2021-09-03 ENCOUNTER — Emergency Department (HOSPITAL_COMMUNITY)
Admission: EM | Admit: 2021-09-03 | Discharge: 2021-09-03 | Disposition: A | Discharge status: Home / Self Care | Payer: Medicare Other | Attending: Emergency Medicine | Admitting: Emergency Medicine

## 2021-09-03 ENCOUNTER — Other Ambulatory Visit: Payer: Self-pay

## 2021-09-03 DIAGNOSIS — Z7982 Long term (current) use of aspirin: Secondary | ICD-10-CM | POA: Diagnosis not present

## 2021-09-03 DIAGNOSIS — R040 Epistaxis: Secondary | ICD-10-CM | POA: Insufficient documentation

## 2021-09-03 DIAGNOSIS — I10 Essential (primary) hypertension: Secondary | ICD-10-CM | POA: Insufficient documentation

## 2021-09-03 MED ORDER — TRANEXAMIC ACID FOR EPISTAXIS
500.0000 mg | Freq: Once | TOPICAL | Status: AC
  Administered 2021-09-03: 500 mg via TOPICAL
  Filled 2021-09-03 (×2): qty 10

## 2021-09-03 MED ORDER — ONDANSETRON 4 MG PO TBDP
4.0000 mg | ORAL_TABLET | Freq: Once | ORAL | Status: DC
  Filled 2021-09-03: qty 1

## 2021-09-03 MED ORDER — ONDANSETRON HCL 4 MG/2ML IJ SOLN
4.0000 mg | Freq: Once | INTRAMUSCULAR | Status: AC
  Administered 2021-09-03: 4 mg via INTRAVENOUS
  Filled 2021-09-03: qty 2

## 2021-09-03 NOTE — ED Provider Notes (Signed)
[facility] DEPT Provider Note  CSN: [id number] Arrival date & time: 09/03/21 6834  Chief Complaint(s) Epistaxis  HPI Jeffrey Rose is a 71 y.o. male who presents to the emergency department with epistaxis.  He was previously seen on the 18th of this month for the same.  He was able to be controlled with Afrin.  This morning he began to bleed again at 3 AM.  Noted to be mostly from the left nare.  He has been using Afrin twice daily.  Due to the amount of blood he did not attempt to use Afrin tonight.  Patient is not anticoagulated.  No trauma.  Reports that he has been sneezing more frequently the over the past couple weeks.   Epistaxis  Past Medical History Past Medical History:  Diagnosis Date   Arthritis    Blood transfusion without reported diagnosis    Hyperlipidemia    Hypertension    Urinary tract infection    Patient Active Problem List   Diagnosis Date Noted   History of colon polyps 05/20/2021   Tendinopathy of gluteus medius 09/14/2020   Vitamin D deficiency 05/10/2020   Hyperglycemia 04/25/2017   Medicare annual wellness visit, initial 03/27/2016   Encounter for well adult exam with abnormal findings 03/27/2016   Essential hypertension 03/27/2016   Periodic health assessment, general screening, adult 01/27/2013   ELBOW PAIN, LEFT 08/31/2009   HIP PAIN, LEFT 08/31/2009   Hallux rigidus 08/31/2009   Mixed hyperlipidemia 06/19/2007   GASTROESOPHAGEAL REFLUX, NO ESOPHAGITIS 06/12/2006   Home Medication(s) Prior to Admission medications   Medication Sig Start Date End Date Taking? Authorizing Provider  aspirin 81 MG tablet Take 1 tablet (81 mg total) by mouth daily. 04/25/17   Biagio Borg, MD  Cholecalciferol (VITAMIN D-3 PO) Take 1 tablet by mouth daily.    [provider]  Glucosamine HCl (GLUCOSAMINE PO) Take 1 tablet by mouth daily.    [provider]  Multiple Vitamins-Minerals (MULTIVITAL PO) Take 1 tablet  by mouth daily.    [provider]  nitroGLYCERIN (NITRODUR - DOSED IN MG/24 HR) 0.2 mg/hr patch Use 1/4 patch daily to the affected area. Patient not taking: Reported on 08/30/2021 09/14/20   Thurman Coyer, DO  Propylene Glycol (SYSTANE BALANCE OP) Place 1 drop into both eyes daily as needed (dru eyes).    [provider]  rosuvastatin (CRESTOR) 40 MG tablet Take 1 tablet (40 mg total) by mouth daily. 05/16/21   Biagio Borg, MD                                                                                                                                    Allergies Patient has no known allergies.  Review of Systems Review of Systems  HENT:  Positive for nosebleeds.   As noted in HPI  Physical Exam Vital Signs  I have reviewed the triage vital  signs BP 135/67   Pulse 84   Temp 97.9 F (36.6 C) (Oral)   Resp 19   Ht '5\' 10"'$  (1.778 m)   Wt 81.6 kg   SpO2 92%   BMI 25.83 kg/m   Physical Exam Vitals reviewed.  Constitutional:      General: He is not in acute distress.    Appearance: He is well-developed. He is not diaphoretic.  HENT:     Head: Normocephalic and atraumatic.     Right Ear: External ear normal.     Left Ear: External ear normal.     Nose:     Right Nostril: Occlusion (with clot) present. No foreign body.     Left Nostril: Epistaxis present. No foreign body.     Mouth/Throat:     Mouth: Mucous membranes are moist.  Eyes:     General: No scleral icterus.    Conjunctiva/sclera: Conjunctivae normal.  Neck:     Trachea: Phonation normal.  Cardiovascular:     Rate and Rhythm: Normal rate and regular rhythm.  Pulmonary:     Effort: Pulmonary effort is normal. No respiratory distress.     Breath sounds: No stridor.  Abdominal:     General: There is no distension.  Musculoskeletal:        General: Normal range of motion.     Cervical back: Normal range of motion.  Neurological:     Mental Status: He is alert and oriented to person, place,  and time.  Psychiatric:        Behavior: Behavior normal.    ED Results and Treatments Labs (all labs ordered are listed, but only abnormal results are displayed) Labs Reviewed - No data to display                                                                                                                       EKG  EKG Interpretation  Date/Time:    Ventricular Rate:    PR Interval:    QRS Duration:   QT Interval:    QTC Calculation:   R Axis:     Text Interpretation:         Radiology No results found.  Pertinent labs & imaging results that were available during my care of the patient were reviewed by me and considered in my medical decision making (see MDM for details).  Medications Ordered in ED Medications  tranexamic acid (CYKLOKAPRON) 1000 MG/10ML topical solution 500 mg (500 mg Topical Given by Other 09/03/21 0526)  ondansetron (ZOFRAN) injection 4 mg (4 mg Intravenous Given 09/03/21 0557)  Procedures .Epistaxis Management  Date/Time: 09/03/2021 6:12 AM Performed by: Fatima Blank, MD Authorized by: Fatima Blank, MD   Consent:    Consent obtained:  Verbal   Risks discussed:  Bleeding   Alternatives discussed:  Delayed treatment Universal protocol:    Procedure explained and questions answered to patient or proxy's satisfaction: yes     Patient identity confirmed:  Verbally with patient Anesthesia:    Anesthesia method:  None Procedure details:    Treatment site:  Unable to specify   Treatment method:  Nasal balloon (TXA atomizer and soaked nasal balloon)   Treatment complexity:  Extensive   Treatment episode: recurring   Post-procedure details:    Assessment:  Bleeding stopped   Procedure completion:  Tolerated well, no immediate complications  (including critical care time)  Medical Decision Making /  ED Course    Complexity of Problem:  Patient's presenting problem/concern, DDX, and MDM listed below: Epistaxis Atraumatic. Likely from allergic rhinitis. We will attempt to control bleeding here in the emergency department.  Initial Intervention:  Manual pressure, ice packs.    Complexity of Data:      ED Course:    Assessment, Add'l Intervention, and Reassessment: Epistaxis Unable to control with manual pressure and cold packs As above, TXA was inserted using an atomizer and in nasal balloon was soaked in TXA and inserted. Hemostasis was accomplished Patient began feeling nauseated, lightheaded and near syncopal. This improved with being placed in a supine position He was given IV fluid bolus. Monitored for several hours without recurrence of bleed Recommend ENT follow-up    Final Clinical Impression(s) / ED Diagnoses Final diagnoses:  Left-sided epistaxis  Epistaxis, recurrent   The patient appears reasonably screened and/or stabilized for discharge and I doubt any other medical condition or other [facility] requiring further screening, evaluation, or treatment in the ED at this time prior to discharge. Safe for discharge with strict return precautions.  Disposition: Discharge  Condition: Good  I have discussed the results, Dx and Tx plan with the patient/family who expressed understanding and agree(s) with the plan. Discharge instructions discussed at length. The patient/family was given strict return precautions who verbalized understanding of the instructions. No further questions at time of discharge.    ED Discharge Orders     None        Follow Up: [facility] [address] [place] [address] [phone]  Call  to schedule an appointment for close follow up           This chart was dictated using voice recognition software.  Despite best efforts to proofread,  errors can occur which can change the  documentation meaning.    Fatima Blank, MD 09/03/21 [phone]

## 2021-09-03 NOTE — ED Triage Notes (Signed)
Pt. Arrives POV with a nosebleed that started at 3am. Pt. Has been applying pressure to the nose, but it hasn't stopped. Pt. Was seen on the 18th for the same. Denies blood thinners.

## 2021-09-03 NOTE — ED Notes (Signed)
1070m fluid bolus started due to pt. Low BP. Pt. Was pale, cool, clammy and not responding to verbal stimuli. Provider notified.

## 2021-09-07 DIAGNOSIS — J342 Deviated nasal septum: Secondary | ICD-10-CM | POA: Diagnosis not present

## 2021-09-07 DIAGNOSIS — R040 Epistaxis: Secondary | ICD-10-CM | POA: Diagnosis not present

## 2021-09-08 ENCOUNTER — Encounter (HOSPITAL_COMMUNITY): Payer: Self-pay | Admitting: Emergency Medicine

## 2021-09-08 ENCOUNTER — Emergency Department (HOSPITAL_COMMUNITY)
Admission: EM | Admit: 2021-09-08 | Discharge: 2021-09-08 | Disposition: A | Discharge status: Home / Self Care | Payer: Medicare Other | Attending: Emergency Medicine | Admitting: Emergency Medicine

## 2021-09-08 ENCOUNTER — Other Ambulatory Visit: Payer: Self-pay

## 2021-09-08 DIAGNOSIS — Z7982 Long term (current) use of aspirin: Secondary | ICD-10-CM | POA: Insufficient documentation

## 2021-09-08 DIAGNOSIS — I10 Essential (primary) hypertension: Secondary | ICD-10-CM | POA: Diagnosis not present

## 2021-09-08 DIAGNOSIS — Z87891 Personal history of nicotine dependence: Secondary | ICD-10-CM | POA: Insufficient documentation

## 2021-09-08 DIAGNOSIS — R040 Epistaxis: Secondary | ICD-10-CM

## 2021-09-08 MED ORDER — OXYMETAZOLINE HCL 0.05 % NA SOLN
1.0000 | Freq: Once | NASAL | Status: AC
  Administered 2021-09-08: 1 via NASAL
  Filled 2021-09-08: qty 30

## 2021-09-08 MED ORDER — LIDOCAINE-EPINEPHRINE (PF) 2 %-1:200000 IJ SOLN
20.0000 mL | Freq: Once | INTRAMUSCULAR | Status: AC
  Administered 2021-09-08: 20 mL
  Filled 2021-09-08: qty 20

## 2021-09-08 MED ORDER — ONDANSETRON 4 MG PO TBDP
4.0000 mg | ORAL_TABLET | Freq: Once | ORAL | Status: AC
  Administered 2021-09-08: 4 mg via ORAL
  Filled 2021-09-08: qty 1

## 2021-09-08 MED ORDER — TRANEXAMIC ACID FOR EPISTAXIS
500.0000 mg | Freq: Once | TOPICAL | Status: AC
  Administered 2021-09-08: 500 mg via TOPICAL
  Filled 2021-09-08: qty 10

## 2021-09-08 MED ORDER — CETIRIZINE HCL 10 MG PO TABS: 10.0000 mg | ORAL_TABLET | Freq: Every day | ORAL | 0 refills | Status: AC

## 2021-09-08 NOTE — Discharge Instructions (Addendum)
Please use a humidifier in your bedroom at nighttime.  Avoid exerting yourself the next 48 hours.  Use saline rinse as needed throughout the day.  Apply topical petroleum jelly product to the inside of your nostrils nightly as directed by ENT  It was a pleasure caring for you today in the emergency department.  Please return to the emergency department for any worsening or worrisome symptoms.

## 2021-09-08 NOTE — ED Provider Notes (Signed)
[facility] EMERGENCY DEPARTMENT Provider Note   CSN: [id number] Arrival date & time: 09/08/21  4098     History  Chief Complaint  Patient presents with   Epistaxis    Jeffrey Rose is a 71 y.o. male.   Patient as above with significant medical history as below, including recurrent epistaxis, HTN, HLD who presents to the ED with complaint of epistaxis. He was seen 5/22  with similar complaint, nasal packing was applied and he was sent to f/u with ENT which he did on 5/26, packing was removed and no further bleeding was noted; was discharged in stable condition. Returned today as nosebleed returned while he was brushing his teeth this AM. No anticoagulation, denies trauma. He has minimal nausea, feels as though he is swallowing some blood.     Past Medical History:  Diagnosis Date   Arthritis    Blood transfusion without reported diagnosis    Hyperlipidemia    Hypertension    Urinary tract infection     Past Surgical History:  Procedure Laterality Date   BUNIONECTOMY Bilateral    COLONOSCOPY     oral growth removed     oral growth removed from mouth.     The history is provided by the patient. No language interpreter was used.  Epistaxis Associated symptoms: no cough, no fever and no headaches       Home Medications Prior to Admission medications   Medication Sig Start Date End Date Taking? Authorizing Provider  cetirizine (ZYRTEC ALLERGY) 10 MG tablet Take 1 tablet (10 mg total) by mouth daily. 09/08/21 10/08/21 Yes Wynona Dove A, DO  aspirin 81 MG tablet Take 1 tablet (81 mg total) by mouth daily. 04/25/17   Biagio Borg, MD  Cholecalciferol (VITAMIN D-3 PO) Take 1 tablet by mouth daily.    [provider]  Glucosamine HCl (GLUCOSAMINE PO) Take 1 tablet by mouth daily.    [provider]  Multiple Vitamins-Minerals (MULTIVITAL PO) Take 1 tablet by mouth daily.    [provider]  nitroGLYCERIN (NITRODUR - DOSED IN  MG/24 HR) 0.2 mg/hr patch Use 1/4 patch daily to the affected area. Patient not taking: Reported on 08/30/2021 09/14/20   Thurman Coyer, DO  Propylene Glycol (SYSTANE BALANCE OP) Place 1 drop into both eyes daily as needed (dru eyes).    [provider]  rosuvastatin (CRESTOR) 40 MG tablet Take 1 tablet (40 mg total) by mouth daily. 05/16/21   Biagio Borg, MD      Allergies    Patient has no known allergies.    Review of Systems   Review of Systems  Constitutional:  Negative for chills and fever.  HENT:  Positive for nosebleeds. Negative for facial swelling and trouble swallowing.   Eyes:  Negative for photophobia and visual disturbance.  Respiratory:  Negative for cough and shortness of breath.   Cardiovascular:  Negative for chest pain and palpitations.  Gastrointestinal:  Positive for nausea. Negative for abdominal pain and vomiting.  Endocrine: Negative for polydipsia and polyuria.  Genitourinary:  Negative for difficulty urinating and hematuria.  Musculoskeletal:  Negative for gait problem and joint swelling.  Skin:  Negative for pallor and rash.  Neurological:  Negative for syncope and headaches.  Psychiatric/Behavioral:  Negative for agitation and confusion.    Physical Exam Updated Vital Signs BP 131/86   Pulse 79   Temp 98.5 F (36.9 C) (Oral)   Resp 19   Ht '5\' 10"'$  (  1.778 m)   Wt 81.6 kg   SpO2 97%   BMI 25.83 kg/m  Physical Exam Vitals and nursing note reviewed.  Constitutional:      General: He is not in acute distress.    Appearance: He is well-developed.  HENT:     Head: Normocephalic and atraumatic.     Right Ear: External ear normal.     Left Ear: External ear normal.     Nose:     Left Nostril: Epistaxis present.     Comments: Small bleeding blood vessel to left septum    Mouth/Throat:     Mouth: Mucous membranes are moist.  Eyes:     General: No scleral icterus. Cardiovascular:     Rate and Rhythm: Normal rate and regular rhythm.      Pulses: Normal pulses.     Heart sounds: Normal heart sounds.  Pulmonary:     Effort: Pulmonary effort is normal. No respiratory distress.     Breath sounds: Normal breath sounds.  Abdominal:     General: Abdomen is flat.     Palpations: Abdomen is soft.     Tenderness: There is no abdominal tenderness.  Musculoskeletal:        General: Normal range of motion.     Cervical back: Normal range of motion.     Right lower leg: No edema.     Left lower leg: No edema.  Skin:    General: Skin is warm and dry.     Capillary Refill: Capillary refill takes less than 2 seconds.  Neurological:     Mental Status: He is alert and oriented to person, place, and time.  Psychiatric:        Mood and Affect: Mood normal.        Behavior: Behavior normal.    ED Results / Procedures / Treatments   Labs (all labs ordered are listed, but only abnormal results are displayed) Labs Reviewed - No data to display  EKG None  Radiology No results found.  Procedures .Epistaxis Management  Date/Time: 09/08/2021 10:31 AM Performed by: Jeanell Sparrow, DO Authorized by: Jeanell Sparrow, DO   Consent:    Consent obtained:  Verbal   Consent given by:  Patient   Risks, benefits, and alternatives were discussed: yes     Risks discussed:  Bleeding and pain   Alternatives discussed:  Delayed treatment, alternative treatment and no treatment Universal protocol:    Procedure explained and questions answered to patient or proxy's satisfaction: yes     Immediately prior to procedure, a time out was called: yes     Patient identity confirmed:  Verbally with patient Anesthesia:    Anesthesia method:  None Procedure details:    Treatment site:  L anterior   Treatment method:  Anterior pack and silver nitrate   Treatment complexity:  Extensive   Treatment episode: recurring   Post-procedure details:    Assessment:  Bleeding stopped   Procedure completion:  Tolerated well, no immediate complications     Medications Ordered in ED Medications  oxymetazoline (AFRIN) 0.05 % nasal spray 1 spray (1 spray Each Nare Given 09/08/21 0727)  lidocaine-EPINEPHrine (XYLOCAINE W/EPI) 2 %-1:200000 (PF) injection 20 mL (20 mLs Other Given 09/08/21 0833)  tranexamic acid (CYKLOKAPRON) 1000 MG/10ML topical solution 500 mg (500 mg Topical Given 09/08/21 0833)  ondansetron (ZOFRAN-ODT) disintegrating tablet 4 mg (4 mg Oral Given 09/08/21 0815)    ED Course/ Medical Decision Making/ A&P Clinical Course as  of 09/08/21 1038  Sat Sep 08, 2021  0948 Bleeding appears to have subsided with direct pressure. Small amount of oozing after nose blowing. I instilled afrin and did apply further direct pressure with lido w/ epi, TXA, and afrin. Will re-apply compression clamp and re-assess to see if any bleeding vessels can be visualized.  [SG]    Clinical Course User Index [SG] Jeanell Sparrow, DO                           Medical Decision Making Risk OTC drugs. Prescription drug management.   CC: epistaxis  This patient presents to the Emergency Department for the above complaint. This involves an extensive number of treatment options and is a complaint that carries with it a high risk of complications and morbidity. Vital signs were reviewed. Serious etiologies considered.  Ddx includes but not limited to anterior nosebleed, posterior nosebleed, avm, arterial bleeding, infection, other acute  etiologies.   Record review:  Previous records obtained and reviewed  Prior ED visits, prior op consultant visits, prior labs and imaging  Additional history obtained from spouse  Medical and surgical history as noted above.   Work up as above, notable for:  Labs & imaging results that were available during my care of the patient were visualized by me and considered in my medical decision making.   Cardiac monitoring reviewed and interpreted personally which shows NSR  Management: Direct pressure applied, epistaxis  management w/ cautery, lido w/ epi/ txa. Also given zofran   Reassessment:  Pt feeling better, no further epistaxis. He is HDS, stable for dc.   Discussed nosebleed precautions with the patient  The patient improved significantly and was discharged in stable condition. Detailed discussions were had with the patient regarding current findings, and need for close f/u with PCP or on call doctor. The patient has been instructed to return immediately if the symptoms worsen in any way for re-evaluation. Patient verbalized understanding and is in agreement with current care plan. All questions answered prior to discharge.                Social determinants of health include -  Social History   Socioeconomic History   Marital status: Married    Spouse name: Not on file   Number of children: 2   Years of education: 16   Highest education level: Not on file  Occupational History   Occupation: Transport planner  Tobacco Use   Smoking status: Former   Smokeless tobacco: Never   Tobacco comments:    smoke pipe, quit 20 years ago  Vaping Use   Vaping Use: Never used  Substance and Sexual Activity   Alcohol use: Yes    Alcohol/week: 2.0 - 3.0 standard drinks    Types: 2 - 3 Glasses of wine per week    Comment: occasionally    Drug use: No   Sexual activity: Not on file  Other Topics Concern   Not on file  Social History Narrative   Fun: Engineer, structural   Social Determinants of Health   Financial Resource Strain: Not on file  Food Insecurity: Not on file  Transportation Needs: Not on file  Physical Activity: Not on file  Stress: Not on file  Social Connections: Not on file  Intimate Partner Violence: Not on file      This chart was dictated using Armed forces training and education officer.  Despite best efforts to proofread,  errors can  occur which can change the documentation meaning.         Final Clinical Impression(s) / ED Diagnoses Final diagnoses:  Acute anterior  epistaxis    Rx / DC Orders ED Discharge Orders          Ordered    cetirizine (ZYRTEC ALLERGY) 10 MG tablet  Daily        09/08/21 1037              Wynona Dove A, DO 09/08/21 1038

## 2021-09-08 NOTE — ED Triage Notes (Signed)
Pt reported to ED with c/o nosebleed that began after brushing teeth this morning. Pt states it has been ongoing for a week. Was discharged from [facility] with Rhinorocket and saw ENT yesterday and Rhinorocket was removed but no cauterization was performed.

## 2021-09-11 ENCOUNTER — Encounter (HOSPITAL_COMMUNITY): Payer: Self-pay | Admitting: Emergency Medicine

## 2021-09-11 ENCOUNTER — Emergency Department (HOSPITAL_COMMUNITY)
Admission: EM | Admit: 2021-09-11 | Discharge: 2021-09-12 | Disposition: A | Discharge status: Home / Self Care | Payer: Medicare Other | Attending: Emergency Medicine | Admitting: Emergency Medicine

## 2021-09-11 ENCOUNTER — Other Ambulatory Visit: Payer: Self-pay

## 2021-09-11 DIAGNOSIS — R040 Epistaxis: Secondary | ICD-10-CM

## 2021-09-11 DIAGNOSIS — R42 Dizziness and giddiness: Secondary | ICD-10-CM | POA: Diagnosis not present

## 2021-09-11 LAB — CBC WITH DIFFERENTIAL/PLATELET
Abs Immature Granulocytes: 0.02 10*3/uL (ref 0.00–0.07)
Basophils Absolute: 0 10*3/uL (ref 0.0–0.1)
Basophils Relative: 0 %
Eosinophils Absolute: 0.3 10*3/uL (ref 0.0–0.5)
Eosinophils Relative: 3 %
HCT: 33.9 % — ABNORMAL LOW (ref 39.0–52.0)
Hemoglobin: 11.2 g/dL — ABNORMAL LOW (ref 13.0–17.0)
Immature Granulocytes: 0 %
Lymphocytes Relative: 33 %
Lymphs Abs: 2.7 10*3/uL (ref 0.7–4.0)
MCH: 30.7 pg (ref 26.0–34.0)
MCHC: 33 g/dL (ref 30.0–36.0)
MCV: 92.9 fL (ref 80.0–100.0)
Monocytes Absolute: 0.8 10*3/uL (ref 0.1–1.0)
Monocytes Relative: 9 %
Neutro Abs: 4.4 10*3/uL (ref 1.7–7.7)
Neutrophils Relative %: 55 %
Platelets: 315 10*3/uL (ref 150–400)
RBC: 3.65 MIL/uL — ABNORMAL LOW (ref 4.22–5.81)
RDW: 12.9 % (ref 11.5–15.5)
WBC: 8.1 10*3/uL (ref 4.0–10.5)
nRBC: 0 % (ref 0.0–0.2)

## 2021-09-11 LAB — BASIC METABOLIC PANEL
Anion gap: 9 (ref 5–15)
BUN: 14 mg/dL (ref 8–23)
CO2: 25 mmol/L (ref 22–32)
Calcium: 9.4 mg/dL (ref 8.9–10.3)
Chloride: 105 mmol/L (ref 98–111)
Creatinine, Ser: 0.9 mg/dL (ref 0.61–1.24)
GFR, Estimated: >60 mL/min (ref >60)
Glucose, Bld: 137 mg/dL — ABNORMAL HIGH (ref 70–99)
Potassium: 4.5 mmol/L (ref 3.5–5.1)
Sodium: 139 mmol/L (ref 135–145)

## 2021-09-11 NOTE — ED Provider Triage Note (Signed)
Emergency Medicine Provider Triage Evaluation Note  Jeffrey Rose , a 71 y.o. male  was evaluated in triage.  Pt complains of recurrent left nare epistaxis that began about an hour prior to arrival.  Patient states that he has had problems with bleeding from the left nare intermittently for the past week and a half, this is his fourth emergency department visit for same, he has had prior Rhino Rocket placement temporarily, he had cauterization on Saturday, had been doing well until around 9:30 PM he had fairly steady bleeding, applied pressure and has since had improvement.  Did have some intermittent fatigue/lightheadedness.  Does not take blood thinners..  Review of Systems  Positive: Epistaxis, lightheadedness Negative: syncope  Physical Exam  BP (!) 155/96 (BP Location: Right Arm)   Pulse 85   Temp 97.9 F (36.6 C)   Resp 18   SpO2 93%  Gen:   Awake, no distress   Resp:  Normal effort  MSK:   Moves extremities without difficulty  Other:  Blood present in the left nare, very minimal ooze of blood at times, no significant active bleeding.  Medical Decision Making  Medically screening exam initiated at 10:40 PM.  Appropriate orders placed.  Jeffrey Rose was informed that the remainder of the evaluation will be completed by another provider, this initial triage assessment does not replace that evaluation, and the importance of remaining in the ED until their evaluation is complete.  We will check basic labs.  Recommended applying pressure if continues to have oozing.  Epistaxis.   Amaryllis Dyke, [place] 09/11/21 2246

## 2021-09-11 NOTE — ED Triage Notes (Signed)
Pt here with nosebleed. Reports being seen 4x in the last 2 weeks for same. Denies blood thinner use.

## 2021-09-12 ENCOUNTER — Encounter (HOSPITAL_COMMUNITY): Payer: Self-pay

## 2021-09-12 LAB — CBC
HCT: 33.2 % — ABNORMAL LOW (ref 39.0–52.0)
Hemoglobin: 10.8 g/dL — ABNORMAL LOW (ref 13.0–17.0)
MCH: 30.9 pg (ref 26.0–34.0)
MCHC: 32.5 g/dL (ref 30.0–36.0)
MCV: 94.9 fL (ref 80.0–100.0)
Platelets: 277 10*3/uL (ref 150–400)
RBC: 3.5 MIL/uL — ABNORMAL LOW (ref 4.22–5.81)
RDW: 13 % (ref 11.5–15.5)
WBC: 7.2 10*3/uL (ref 4.0–10.5)
nRBC: 0 % (ref 0.0–0.2)

## 2021-09-12 LAB — CBG MONITORING, ED: Glucose-Capillary: 156 mg/dL — ABNORMAL HIGH (ref 70–99)

## 2021-09-12 MED ORDER — SODIUM CHLORIDE 0.9 % IV BOLUS
1000.0000 mL | Freq: Once | INTRAVENOUS | Status: AC
  Administered 2021-09-12: 1000 mL via INTRAVENOUS

## 2021-09-12 NOTE — ED Provider Notes (Signed)
[facility] Emergency Department Provider Note MRN:  [id number]  Arrival date & time: 09/12/21     Chief Complaint   Epistaxis   History of Present Illness   Jeffrey Rose is a 71 y.o. year-old male presents to the ED with chief complaint of epistaxis.  This is patient's fourth visit for the same.  Has been seen by ENT, Dr. Wilburn Cornelia for packing removal.  Was encouraged to use nasal saline mist and gel.  Patient reports compliance, but states that he began having nosebleed again tonight.  He denies any trauma.  He is not anticoagulated.  He states that he feels slightly dizzy.  History provided by patient.   Review of Systems  Pertinent review of systems noted in HPI.    Physical Exam   Vitals:   09/12/21 0337 09/12/21 0345  BP: (!) 149/86 139/83  Pulse: 75 69  Resp: (!) 21 17  Temp:    SpO2: 97% 94%    CONSTITUTIONAL:  well-appearing, NAD NEURO:  Alert and oriented x 3, CN 3-12 grossly intact EYES:  eyes equal and reactive ENT/NECK:  Supple, no stridor, dried blood in left nares, no active bleeding, no blood in oropharynx CARDIO:  normal rate, regular rhythm, appears well-perfused  PULM:  No respiratory distress,  GI/GU:  non-distended,  MSK/SPINE:  No gross deformities, no edema, moves all extremities  SKIN:  no rash, atraumatic   *Additional and/or pertinent findings included in MDM below  Diagnostic and Interventional Summary    EKG Interpretation  Date/Time:    Ventricular Rate:    PR Interval:    QRS Duration:   QT Interval:    QTC Calculation:   R Axis:     Text Interpretation:         Labs Reviewed  CBC WITH DIFFERENTIAL/PLATELET - Abnormal; Notable for the following components:      Result Value   RBC 3.65 (*)    Hemoglobin 11.2 (*)    HCT 33.9 (*)    All other components within normal limits  BASIC METABOLIC PANEL - Abnormal; Notable for the following components:   Glucose, Bld 137 (*)    All other components  within normal limits  CBC - Abnormal; Notable for the following components:   RBC 3.50 (*)    Hemoglobin 10.8 (*)    HCT 33.2 (*)    All other components within normal limits  CBG MONITORING, ED - Abnormal; Notable for the following components:   Glucose-Capillary 156 (*)    All other components within normal limits    No orders to display    Medications  sodium chloride 0.9 % bolus 1,000 mL (1,000 mLs Intravenous New Bag/Given 09/12/21 0419)     Procedures  /  Critical Care Procedures  ED Course and Medical Decision Making  I have reviewed the triage vital signs, the nursing notes, and pertinent available records from the EMR.  Social Determinants Affecting Complexity of Care: Patient has no clinically significant social determinants affecting this chief complaint..   ED Course:   Patient here with epistaxis, thought secondary to dry nasal mucosa vs digital trauma.   Has seen ENT.  Not bleeding now.  Not anticoagulated.  Patient felt a little dizzy after ambulating.  I gave patient a liter of fluid and rechecked HGB. Medical Decision Making Problems Addressed: Epistaxis: acute illness or injury  Amount and/or Complexity of Data Reviewed Labs: ordered.    Details: Hemoglobin is 11.2, repeat was 10.8  Consultants: No consultations were needed in caring for this patient.   Treatment and Plan:  Emergency department workup does not suggest an emergent condition requiring admission or immediate intervention beyond  what has been performed at this time. The patient is safe for discharge and has  been instructed to return immediately for worsening symptoms, change in  symptoms or any other concerns  Patient discussed with attending physician, Dr. Laverta Baltimore, who agrees with plan for discharge.  Final Clinical Impressions(s) / ED Diagnoses     ICD-10-CM   1. Epistaxis  R04.0       ED Discharge Orders     None         Discharge Instructions Discussed with and  Provided to Patient:   Discharge Instructions   None      Montine Circle, PA-C 09/12/21 Edyth Gunnels, MD 09/12/21 [phone]

## 2021-09-14 ENCOUNTER — Encounter: Payer: Self-pay | Admitting: Internal Medicine

## 2021-09-14 ENCOUNTER — Ambulatory Visit: Payer: Medicare Other | Admitting: Internal Medicine

## 2021-09-14 VITALS — BP 126/80 | HR 55 | Resp 18 | Ht 70.0 in | Wt 188.0 lb

## 2021-09-14 DIAGNOSIS — R040 Epistaxis: Secondary | ICD-10-CM | POA: Diagnosis not present

## 2021-09-14 DIAGNOSIS — J309 Allergic rhinitis, unspecified: Secondary | ICD-10-CM | POA: Diagnosis not present

## 2021-09-14 DIAGNOSIS — R739 Hyperglycemia, unspecified: Secondary | ICD-10-CM

## 2021-09-14 DIAGNOSIS — D649 Anemia, unspecified: Secondary | ICD-10-CM

## 2021-09-14 DIAGNOSIS — I10 Essential (primary) hypertension: Secondary | ICD-10-CM | POA: Diagnosis not present

## 2021-09-14 NOTE — Progress Notes (Signed)
Patient ID: Jeffrey Rose, male   DOB: Nov 20, 1950, 71 y.o.   MRN: [id number]        Chief Complaint: follow up recurrent nosebleeds, anemia, hyperglycemia, htn       HPI:  Jeffrey Rose is a 71 y.o. male here to f/u as has been to ED 4 times with nosebleed, not on anticoagulant, and each time bleeding is stopped but keeps recurring, seems mostly unprovoked but has occurred only since onset of worsening seasonal allergies this spring; Does have several wks ongoing nasal allergy symptoms with clearish congestion, itch and sneezing, without fever, pain, ST, cough, swelling or wheezing.  Did have mild anemia at last check, declines f/u lab today. Wondering if should take iron anyway.  Zyrtec not helping reduce the nosebleeds.  Has seen Dr Wilburn Cornelia ENT but no specific tx apparently needed, but still had more nosebleed       Wt Readings from Last 3 Encounters:  09/14/21 188 lb (85.3 kg)  09/08/21 180 lb (81.6 kg)  09/03/21 180 lb (81.6 kg)   BP Readings from Last 3 Encounters:  09/14/21 126/80  09/12/21 122/80  09/08/21 117/85         Past Medical History:  Diagnosis Date   Arthritis    Blood transfusion without reported diagnosis    Hyperlipidemia    Hypertension    Urinary tract infection    Past Surgical History:  Procedure Laterality Date   BUNIONECTOMY Bilateral    COLONOSCOPY     oral growth removed     oral growth removed from mouth.    reports that he has quit smoking. He has never used smokeless tobacco. He reports current alcohol use of about 2.0 - 3.0 standard drinks per week. He reports that he does not use drugs. family history includes Diabetes in his mother; Emphysema in his father; Heart disease in his father; Kidney disease in his mother. No Known Allergies Current Outpatient Medications on File Prior to Visit  Medication Sig Dispense Refill   aspirin 81 MG tablet Take 1 tablet (81 mg total) by mouth daily. 90 tablet 3   cetirizine (ZYRTEC ALLERGY) 10 MG tablet  Take 1 tablet (10 mg total) by mouth daily. 30 tablet 0   Cholecalciferol (VITAMIN D-3 PO) Take 1 tablet by mouth daily.     Glucosamine HCl (GLUCOSAMINE PO) Take 1 tablet by mouth daily.     Multiple Vitamins-Minerals (MULTIVITAL PO) Take 1 tablet by mouth daily.     Propylene Glycol (SYSTANE BALANCE OP) Place 1 drop into both eyes daily as needed (dru eyes).     rosuvastatin (CRESTOR) 40 MG tablet Take 1 tablet (40 mg total) by mouth daily. 90 tablet 3   No current facility-administered medications on file prior to visit.        ROS:  All others reviewed and negative.  Objective        PE:  BP 126/80   Pulse (!) 55   Resp 18   Ht '5\' 10"'$  (1.778 m)   Wt 188 lb (85.3 kg)   SpO2 98%   BMI 26.98 kg/m                 Constitutional: Pt appears in NAD               HENT: Head: NCAT.                Right Ear: External ear normal.  Left Ear: External ear normal.                Eyes: . Pupils are equal, round, and reactive to light. Conjunctivae and EOM are normal               Nose: without d/c or deformity               Neck: Neck supple. Gross normal ROM               Cardiovascular: Normal rate and regular rhythm.                 Pulmonary/Chest: Effort normal and breath sounds without rales or wheezing.                Abd:  Soft, NT, ND, + BS, no organomegaly               Neurological: Pt is alert. At baseline orientation, motor grossly intact               Skin: Skin is warm. No rashes, no other new lesions, LE edema - none               Psychiatric: Pt behavior is normal without agitation   Micro: none  Cardiac tracings I have personally interpreted today:  none  Pertinent Radiological findings (summarize): none   Lab Results  Component Value Date   WBC 7.2 09/12/2021   HGB 10.8 (L) 09/12/2021   HCT 33.2 (L) 09/12/2021   PLT 277 09/12/2021   GLUCOSE 137 (H) 09/11/2021   CHOL 170 05/09/2021   TRIG 198.0 (H) 05/09/2021   HDL 50.60 05/09/2021    LDLDIRECT 101.0 05/05/2019   LDLCALC 80 05/09/2021   ALT 41 05/09/2021   AST 28 05/09/2021   NA 139 09/11/2021   K 4.5 09/11/2021   CL 105 09/11/2021   CREATININE 0.90 09/11/2021   BUN 14 09/11/2021   CO2 25 09/11/2021   TSH 2.44 05/09/2021   PSA 1.18 05/09/2021   HGBA1C 6.3 05/09/2021   Assessment/Plan:  Jeffrey Rose is a 71 y.o. White or Caucasian [1] male with  has a past medical history of Arthritis, Blood transfusion without reported diagnosis, Hyperlipidemia, Hypertension, and Urinary tract infection.  Hyperglycemia Lab Results  Component Value Date   HGBA1C 6.3 05/09/2021   Stable, pt to continue current medical treatment  - diet, wt control, excercise   Allergic rhinitis Mild to mod seasonal flare, for add nasacort otc asd  to f/u any worsening symptoms or concerns   Anemia Mild to mod, recent due to ABL, declines f/u cbc since no bleeding since last check, ok for otc iron sulfate 325 qd for 3 mo,  to f/u any worsening symptoms or concerns  Lab Results  Component Value Date   WBC 7.2 09/12/2021   HGB 10.8 (L) 09/12/2021   HCT 33.2 (L) 09/12/2021   MCV 94.9 09/12/2021   PLT 277 09/12/2021     Essential hypertension BP Readings from Last 3 Encounters:  09/14/21 126/80  09/12/21 122/80  09/08/21 117/85   Stable, pt to continue medical treatment  - diet, wt control, excercise   Frequent nosebleeds Most likely due to friability related to seasonal allergies, for contd zyrtec and nasacort asd, CT sinus r/o cyst and refer ENT Dr Benjamine Mola per wife request  Followup: Return if symptoms worsen or fail to improve.  Cathlean Cower, MD 09/16/2021 9:04 PM [place]  Primary Care - [facility] Internal Medicine

## 2021-09-14 NOTE — Patient Instructions (Addendum)
Please take all new medication as prescribed - the OTC Nasacort  Also consider taking OTC Iron sulfate 325 mg - 1 per day for 3 months  Please continue all other medications as before  Please have the pharmacy call with any other refills you may need.  Please keep your appointments with your specialists as you may have planned  You will be contacted regarding the referral for: CT sinus and Dr Melene Plan  We can hold on further lab testing today

## 2021-09-16 ENCOUNTER — Encounter: Payer: Self-pay | Admitting: Internal Medicine

## 2021-09-16 NOTE — Assessment & Plan Note (Signed)
Lab Results  Component Value Date   HGBA1C 6.3 05/09/2021   Stable, pt to continue current medical treatment  - diet, wt control, excercise

## 2021-09-16 NOTE — Assessment & Plan Note (Signed)
Mild to mod, recent due to ABL, declines f/u cbc since no bleeding since last check, ok for otc iron sulfate 325 qd for 3 mo,  to f/u any worsening symptoms or concerns  Lab Results  Component Value Date   WBC 7.2 09/12/2021   HGB 10.8 (L) 09/12/2021   HCT 33.2 (L) 09/12/2021   MCV 94.9 09/12/2021   PLT 277 09/12/2021

## 2021-09-16 NOTE — Assessment & Plan Note (Signed)
BP Readings from Last 3 Encounters:  09/14/21 126/80  09/12/21 122/80  09/08/21 117/85   Stable, pt to continue medical treatment  - diet, wt control, excercise

## 2021-09-16 NOTE — Assessment & Plan Note (Signed)
Most likely due to friability related to seasonal allergies, for contd zyrtec and nasacort asd, CT sinus r/o cyst and refer ENT Dr Benjamine Mola per wife request

## 2021-09-16 NOTE — Assessment & Plan Note (Signed)
Mild to mod seasonal flare, for add nasacort otc asd  to f/u any worsening symptoms or concerns

## 2021-09-17 ENCOUNTER — Telehealth: Payer: Self-pay | Admitting: Internal Medicine

## 2021-09-17 ENCOUNTER — Ambulatory Visit (HOSPITAL_BASED_OUTPATIENT_CLINIC_OR_DEPARTMENT_OTHER)
Admission: RE | Admit: 2021-09-17 | Discharge: 2021-09-17 | Disposition: A | Discharge status: Home / Self Care | Payer: Medicare Other | Source: Ambulatory Visit | Attending: Internal Medicine | Admitting: Internal Medicine

## 2021-09-17 ENCOUNTER — Encounter (HOSPITAL_BASED_OUTPATIENT_CLINIC_OR_DEPARTMENT_OTHER): Payer: Self-pay

## 2021-09-17 DIAGNOSIS — J3489 Other specified disorders of nose and nasal sinuses: Secondary | ICD-10-CM | POA: Diagnosis not present

## 2021-09-17 DIAGNOSIS — R040 Epistaxis: Secondary | ICD-10-CM | POA: Insufficient documentation

## 2021-09-17 NOTE — Telephone Encounter (Signed)
Patients wife call back and said that they got permission from the office manager for a STAT referral to be sent to Dr. Melida Quitter [facility] ENT - [facility] - Fax:  [phone] - Attn:  Corbin Ade.Please send CT scan when it is advailable.

## 2021-09-17 NOTE — Telephone Encounter (Signed)
Urgent referral ordered

## 2021-09-17 NOTE — Telephone Encounter (Signed)
Patients wife notified

## 2021-09-17 NOTE — Telephone Encounter (Signed)
Patient's wife has questions concerning Dr. Melene Plan - CT scan is scheduled for 12:30 today - if scan shows problems can patient go back to Dr. Wilburn Cornelia if Dr. Melene Plan will not work him in.

## 2021-09-17 NOTE — Telephone Encounter (Signed)
Patient was seen in office 09/14/21 for constant nose bleeds. Dr. Redmond Baseman office is asking that a STAT referral be placed so that they can get patient scheduled. Information for referral is listed below in the previous message. Please advise in the absence of Dr. Jenny Reichmann.

## 2021-09-17 NOTE — Telephone Encounter (Signed)
Patients wife is calling back concerning referral - please advise status

## 2021-09-18 ENCOUNTER — Telehealth: Payer: Self-pay

## 2021-09-18 ENCOUNTER — Telehealth: Payer: Self-pay | Admitting: Internal Medicine

## 2021-09-18 DIAGNOSIS — R040 Epistaxis: Secondary | ICD-10-CM

## 2021-09-18 NOTE — Telephone Encounter (Signed)
Pt is going to see ENT for first time and wife is requesting to have labs (CBC/BMP) drawn to see where his numbers are since having 2 MASSIVE NOSE BLEEDS in her words.Would that come from ENT or is that something that you could order?

## 2021-09-18 NOTE — Telephone Encounter (Signed)
[place] for labs  - ordered

## 2021-09-18 NOTE — Telephone Encounter (Signed)
Yes, I left a note for Lovena Le to let the wife know lab tests are ordered

## 2021-09-19 ENCOUNTER — Other Ambulatory Visit (INDEPENDENT_AMBULATORY_CARE_PROVIDER_SITE_OTHER): Payer: Medicare Other

## 2021-09-19 DIAGNOSIS — R040 Epistaxis: Secondary | ICD-10-CM

## 2021-09-19 LAB — CBC WITH DIFFERENTIAL/PLATELET
Basophils Absolute: 0 10*3/uL (ref 0.0–0.1)
Basophils Relative: 0.4 % (ref 0.0–3.0)
Eosinophils Absolute: 0.2 10*3/uL (ref 0.0–0.7)
Eosinophils Relative: 2.6 % (ref 0.0–5.0)
HCT: 27.9 % — ABNORMAL LOW (ref 39.0–52.0)
Hemoglobin: 9.3 g/dL — ABNORMAL LOW (ref 13.0–17.0)
Lymphocytes Relative: 30.1 % (ref 12.0–46.0)
Lymphs Abs: 1.9 10*3/uL (ref 0.7–4.0)
MCHC: 33.5 g/dL (ref 30.0–36.0)
MCV: 93.1 fl (ref 78.0–100.0)
Monocytes Absolute: 0.9 10*3/uL (ref 0.1–1.0)
Monocytes Relative: 14.2 % — ABNORMAL HIGH (ref 3.0–12.0)
Neutro Abs: 3.3 10*3/uL (ref 1.4–7.7)
Neutrophils Relative %: 52.7 % (ref 43.0–77.0)
Platelets: 301 10*3/uL (ref 150.0–400.0)
RBC: 2.99 Mil/uL — ABNORMAL LOW (ref 4.22–5.81)
RDW: 13.9 % (ref 11.5–15.5)
WBC: 6.3 10*3/uL (ref 4.0–10.5)

## 2021-09-19 LAB — FERRITIN: Ferritin: 28.1 ng/mL (ref 22.0–322.0)

## 2021-09-19 LAB — IBC PANEL
Iron: 289 ug/dL — ABNORMAL HIGH (ref 42–165)
Saturation Ratios: 86 % — ABNORMAL HIGH (ref 20.0–50.0)
TIBC: 336 ug/dL (ref 250.0–450.0)
Transferrin: 240 mg/dL (ref 212.0–360.0)

## 2021-11-30 ENCOUNTER — Telehealth: Payer: Self-pay | Admitting: Internal Medicine

## 2021-11-30 NOTE — Telephone Encounter (Signed)
Left message for patient to call back to schedule Medicare Annual Wellness Visit   Last AWV  03/27/16  Please schedule at anytime with [facility] if patient calls the office back.     Any questions, please call me at [phone]

## 2022-04-11 DIAGNOSIS — H2513 Age-related nuclear cataract, bilateral: Secondary | ICD-10-CM | POA: Diagnosis not present

## 2022-05-15 DIAGNOSIS — C44319 Basal cell carcinoma of skin of other parts of face: Secondary | ICD-10-CM | POA: Diagnosis not present

## 2022-05-18 ENCOUNTER — Encounter: Payer: Self-pay | Admitting: Internal Medicine

## 2022-05-30 ENCOUNTER — Ambulatory Visit (INDEPENDENT_AMBULATORY_CARE_PROVIDER_SITE_OTHER): Payer: Medicare Other | Admitting: Internal Medicine

## 2022-05-30 VITALS — BP 130/76 | HR 62 | Temp 97.9°F | Ht 70.0 in | Wt 191.0 lb

## 2022-05-30 DIAGNOSIS — E538 Deficiency of other specified B group vitamins: Secondary | ICD-10-CM | POA: Diagnosis not present

## 2022-05-30 DIAGNOSIS — E559 Vitamin D deficiency, unspecified: Secondary | ICD-10-CM | POA: Diagnosis not present

## 2022-05-30 DIAGNOSIS — E782 Mixed hyperlipidemia: Secondary | ICD-10-CM

## 2022-05-30 DIAGNOSIS — I10 Essential (primary) hypertension: Secondary | ICD-10-CM | POA: Diagnosis not present

## 2022-05-30 DIAGNOSIS — Z0001 Encounter for general adult medical examination with abnormal findings: Secondary | ICD-10-CM

## 2022-05-30 DIAGNOSIS — R739 Hyperglycemia, unspecified: Secondary | ICD-10-CM | POA: Diagnosis not present

## 2022-05-30 DIAGNOSIS — Z125 Encounter for screening for malignant neoplasm of prostate: Secondary | ICD-10-CM

## 2022-05-30 DIAGNOSIS — Z0000 Encounter for general adult medical examination without abnormal findings: Secondary | ICD-10-CM

## 2022-05-30 DIAGNOSIS — R9431 Abnormal electrocardiogram [ECG] [EKG]: Secondary | ICD-10-CM

## 2022-05-30 LAB — URINALYSIS, ROUTINE W REFLEX MICROSCOPIC
Bilirubin Urine: NEGATIVE
Hgb urine dipstick: NEGATIVE
Ketones, ur: NEGATIVE
Leukocytes,Ua: NEGATIVE
Nitrite: NEGATIVE
Specific Gravity, Urine: 1.025 (ref 1.000–1.030)
Total Protein, Urine: NEGATIVE
Urine Glucose: NEGATIVE
Urobilinogen, UA: 0.2 (ref 0.0–1.0)
pH: 6 (ref 5.0–8.0)

## 2022-05-30 LAB — LIPID PANEL
Cholesterol: 265 mg/dL — ABNORMAL HIGH (ref 0–200)
HDL: 58.5 mg/dL (ref >39.00)
NonHDL: 206.9
Total CHOL/HDL Ratio: 5
Triglycerides: 250 mg/dL — ABNORMAL HIGH (ref 0.0–149.0)
VLDL: 50 mg/dL — ABNORMAL HIGH (ref 0.0–40.0)

## 2022-05-30 LAB — CBC WITH DIFFERENTIAL/PLATELET
Basophils Absolute: 0 10*3/uL (ref 0.0–0.1)
Basophils Relative: 0.8 % (ref 0.0–3.0)
Eosinophils Absolute: 0.2 10*3/uL (ref 0.0–0.7)
Eosinophils Relative: 5.7 % — ABNORMAL HIGH (ref 0.0–5.0)
HCT: 48.1 % (ref 39.0–52.0)
Hemoglobin: 16.1 g/dL (ref 13.0–17.0)
Lymphocytes Relative: 42.1 % (ref 12.0–46.0)
Lymphs Abs: 1.8 10*3/uL (ref 0.7–4.0)
MCHC: 33.4 g/dL (ref 30.0–36.0)
MCV: 93.1 fl (ref 78.0–100.0)
Monocytes Absolute: 0.7 10*3/uL (ref 0.1–1.0)
Monocytes Relative: 16 % — ABNORMAL HIGH (ref 3.0–12.0)
Neutro Abs: 1.5 10*3/uL (ref 1.4–7.7)
Neutrophils Relative %: 35.4 % — ABNORMAL LOW (ref 43.0–77.0)
Platelets: 203 10*3/uL (ref 150.0–400.0)
RBC: 5.16 Mil/uL (ref 4.22–5.81)
RDW: 12.3 % (ref 11.5–15.5)
WBC: 4.4 10*3/uL (ref 4.0–10.5)

## 2022-05-30 LAB — HEPATIC FUNCTION PANEL
ALT: 28 U/L (ref 0–53)
AST: 24 U/L (ref 0–37)
Albumin: 4.3 g/dL (ref 3.5–5.2)
Alkaline Phosphatase: 61 U/L (ref 39–117)
Bilirubin, Direct: 0.2 mg/dL (ref 0.0–0.3)
Total Bilirubin: 1 mg/dL (ref 0.2–1.2)
Total Protein: 7.4 g/dL (ref 6.0–8.3)

## 2022-05-30 LAB — TSH: TSH: 1.92 u[IU]/mL (ref 0.35–5.50)

## 2022-05-30 LAB — VITAMIN B12: Vitamin B-12: 416 pg/mL (ref 211–911)

## 2022-05-30 LAB — HEMOGLOBIN A1C: Hgb A1c MFr Bld: 6.3 % (ref 4.6–6.5)

## 2022-05-30 LAB — BASIC METABOLIC PANEL
BUN: 16 mg/dL (ref 6–23)
CO2: 29 mEq/L (ref 19–32)
Calcium: 9.8 mg/dL (ref 8.4–10.5)
Chloride: 103 mEq/L (ref 96–112)
Creatinine, Ser: 0.99 mg/dL (ref 0.40–1.50)
GFR: 76.83 mL/min (ref >60.00)
Glucose, Bld: 87 mg/dL (ref 70–99)
Potassium: 4.8 mEq/L (ref 3.5–5.1)
Sodium: 142 mEq/L (ref 135–145)

## 2022-05-30 LAB — VITAMIN D 25 HYDROXY (VIT D DEFICIENCY, FRACTURES): VITD: 38.95 ng/mL (ref 30.00–100.00)

## 2022-05-30 LAB — PSA: PSA: 1 ng/mL (ref 0.10–4.00)

## 2022-05-30 LAB — LDL CHOLESTEROL, DIRECT: Direct LDL: 145 mg/dL

## 2022-05-30 MED ORDER — ROSUVASTATIN CALCIUM 40 MG PO TABS: 40.0000 mg | ORAL_TABLET | Freq: Every day | ORAL | 3 refills | Status: DC

## 2022-05-30 NOTE — Assessment & Plan Note (Signed)
Last vitamin D Lab Results  Component Value Date   VD25OH 49.98 05/09/2021   Stable, cont oral replacement

## 2022-05-30 NOTE — Progress Notes (Signed)
Patient ID: Jeffrey Rose, male   DOB: [birth year], 72 y.o.   MRN: [id number]         Chief Complaint:: wellness exam and htn, hyperglycemia, hld, low vit d       HPI:  Jeffrey Rose is a 72 y.o. male here for wellness exam; declines covid booster, o/w up to date                        Also Pt denies chest pain, increased sob or doe, wheezing, orthopnea, PND, increased LE swelling, palpitations, dizziness or syncope.   Pt denies polydipsia, polyuria, or new focal neuro s/s.   [name] Pt denies fever, wt loss, night sweats, loss of appetite, or other constitutional symptoms  Pt interested in Cardiac Ct score.  Did have recent left temple basal cell removed last wk.     Wt Readings from Last 3 Encounters:  05/30/22 191 lb (86.6 kg)  09/14/21 188 lb (85.3 kg)  09/08/21 180 lb (81.6 kg)   BP Readings from Last 3 Encounters:  05/30/22 130/76  09/14/21 126/80  09/12/21 122/80   Immunization History  Administered Date(s) Administered   Influenza Split 01/27/2013   Influenza, High Dose Seasonal PF 02/24/2018   Influenza-Unspecified 02/13/2017, 02/27/2018, 02/06/2020, 02/11/2021, 01/28/2022   Moderna SARS-COV2 Booster Vaccination 02/06/2020   Moderna Sars-Covid-2 Vaccination 05/20/2019, 06/15/2019   PFIZER(Purple Top)SARS-COV-2 Vaccination 11/16/2020   Pneumococcal Conjugate-13 05/05/2019   Pneumococcal Polysaccharide-23 04/25/2017   Tdap 03/27/2016   Zoster Recombinat (Shingrix) 02/09/2021, 05/18/2021  There are no preventive care reminders to display for this patient.    Past Medical History:  Diagnosis Date   Arthritis    Blood transfusion without reported diagnosis    Hyperlipidemia    Hypertension    Urinary tract infection    Past Surgical History:  Procedure Laterality Date   BUNIONECTOMY Bilateral    COLONOSCOPY     oral growth removed     oral growth removed from mouth.    reports that he has quit smoking. He has never used smokeless tobacco. He reports current alcohol  use of about 2.0 - 3.0 standard drinks of alcohol per week. He reports that he does not use drugs. family history includes Diabetes in his mother; Emphysema in his father; Heart disease in his father; Kidney disease in his mother. No Known Allergies Current Outpatient Medications on File Prior to Visit  Medication Sig Dispense Refill   aspirin 81 MG tablet Take 1 tablet (81 mg total) by mouth daily. 90 tablet 3   Cholecalciferol (VITAMIN D-3 PO) Take 1 tablet by mouth daily.     Glucosamine HCl (GLUCOSAMINE PO) Take 1 tablet by mouth daily.     Multiple Vitamins-Minerals (MULTIVITAL PO) Take 1 tablet by mouth daily.     Propylene Glycol (SYSTANE BALANCE OP) Place 1 drop into both eyes daily as needed (dru eyes).     cetirizine (ZYRTEC ALLERGY) 10 MG tablet Take 1 tablet (10 mg total) by mouth daily. 30 tablet 0   No current facility-administered medications on file prior to visit.        ROS:  All others reviewed and negative.  Objective        PE:  BP 130/76 (BP Location: Right Arm, Patient Position: Sitting, Cuff Size: Large)   Pulse 62   Temp 97.9 F (36.6 C) (Oral)   Ht 5' 10"$  (1.778 m)   Wt 191 lb (86.6 kg)   SpO2 95%  BMI 27.41 kg/m                 Constitutional: Pt appears in NAD               HENT: Head: NCAT.                Right Ear: External ear normal.                 Left Ear: External ear normal.                Eyes: . Pupils are equal, round, and reactive to light. Conjunctivae and EOM are normal               Nose: without d/c or deformity               Neck: Neck supple. Gross normal ROM               Cardiovascular: Normal rate and regular rhythm.                 Pulmonary/Chest: Effort normal and breath sounds without rales or wheezing.                Abd:  Soft, NT, ND, + BS, no organomegaly               Neurological: Pt is alert. At baseline orientation, motor grossly intact               Skin: Skin is warm. No rashes, no other new lesions, LE edema -  none               Psychiatric: Pt behavior is normal without agitation   Micro: none  Cardiac tracings I have personally interpreted today:  none  Pertinent Radiological findings (summarize): none   Lab Results  Component Value Date   WBC 4.4 05/30/2022   HGB 16.1 05/30/2022   HCT 48.1 05/30/2022   PLT 203.0 05/30/2022   GLUCOSE 87 05/30/2022   CHOL 265 (H) 05/30/2022   TRIG 250.0 (H) 05/30/2022   HDL 58.50 05/30/2022   LDLDIRECT 145.0 05/30/2022   LDLCALC 80 05/09/2021   ALT 28 05/30/2022   AST 24 05/30/2022   NA 142 05/30/2022   K 4.8 05/30/2022   CL 103 05/30/2022   CREATININE 0.99 05/30/2022   BUN 16 05/30/2022   CO2 29 05/30/2022   TSH 1.92 05/30/2022   PSA 1.00 05/30/2022   HGBA1C 6.3 05/30/2022   Assessment/Plan:  Jeffrey Rose is a 72 y.o. White or Caucasian [1] male with  has a past medical history of Arthritis, Blood transfusion without reported diagnosis, Hyperlipidemia, Hypertension, and Urinary tract infection.  Vitamin D deficiency Last vitamin D Lab Results  Component Value Date   VD25OH 49.98 05/09/2021   Stable, cont oral replacement   Encounter for well adult exam with abnormal findings Age and sex appropriate education and counseling updated with regular exercise and diet Referrals for preventative services - none needed Immunizations addressed - declines covid booster Smoking counseling  - none needed Evidence for depression or other mood disorder - none significant Most recent labs reviewed. I have personally reviewed and have noted: 1) the patient's medical and social history 2) The patient's current medications and supplements 3) The patient's height, weight, and BMI have been recorded in the chart   Essential hypertension BP Readings from Last 3 Encounters:  05/30/22 130/76  09/14/21 126/80  09/12/21 122/80  Stable, pt to continue medical treatment - diet, wt control, low salt, excercise   Hyperglycemia Lab Results   Component Value Date   HGBA1C 6.3 05/30/2022   Stable, pt to continue current medical treatment  - diet, wt control   Mixed hyperlipidemia Lab Results  Component Value Date   LDLCALC 80 05/09/2021   Stable, pt to continue current statin crestor 40 mg, also for Cardiac CT score  Followup: Return in about 1 year (around 05/31/2023).  Cathlean Cower, MD 06/01/2022 6:42 PM [facility]

## 2022-05-30 NOTE — Patient Instructions (Signed)
Please continue all other medications as before, and refills have been done if requested.  Please have the pharmacy call with any other refills you may need.  Please continue your efforts at being more active, low cholesterol diet, and weight control.  You are otherwise up to date with prevention measures today.  Please keep your appointments with your specialists as you may have planned  You will be contacted regarding the referral for: Cardiac CT score  Please go to the LAB at the blood drawing area for the tests to be done  You will be contacted by phone if any changes need to be made immediately.  Otherwise, you will receive a letter about your results with an explanation, but please check with MyChart first.  Please remember to sign up for MyChart if you have not done so, as this will be important to you in the future with finding out test results, communicating by private email, and scheduling acute appointments online when needed.  Please make an Appointment to return for your 1 year visit, or sooner if needed, with Lab testing by Appointment as well, to be done about 3-5 days before at the [facility] (so this is for TWO appointments - please see the scheduling desk as you leave)

## 2022-06-01 ENCOUNTER — Encounter: Payer: Self-pay | Admitting: Internal Medicine

## 2022-06-01 NOTE — Assessment & Plan Note (Signed)
BP Readings from Last 3 Encounters:  05/30/22 130/76  09/14/21 126/80  09/12/21 122/80   Stable, pt to continue medical treatment - diet, wt control, low salt, excercise

## 2022-06-01 NOTE — Assessment & Plan Note (Signed)
Lab Results  Component Value Date   HGBA1C 6.3 05/30/2022   Stable, pt to continue current medical treatment  - diet, wt control

## 2022-06-01 NOTE — Assessment & Plan Note (Signed)

## 2022-06-01 NOTE — Assessment & Plan Note (Signed)
Lab Results  Component Value Date   LDLCALC 80 05/09/2021   Stable, pt to continue current statin crestor 40 mg, also for Cardiac CT score

## 2022-06-18 ENCOUNTER — Ambulatory Visit (HOSPITAL_COMMUNITY)
Admission: RE | Admit: 2022-06-18 | Discharge: 2022-06-18 | Disposition: A | Discharge status: Home / Self Care | Payer: Medicare Other | Source: Ambulatory Visit | Attending: Internal Medicine | Admitting: Internal Medicine

## 2022-06-18 DIAGNOSIS — I10 Essential (primary) hypertension: Secondary | ICD-10-CM

## 2022-06-18 DIAGNOSIS — E782 Mixed hyperlipidemia: Secondary | ICD-10-CM | POA: Insufficient documentation

## 2022-06-18 DIAGNOSIS — R739 Hyperglycemia, unspecified: Secondary | ICD-10-CM

## 2022-06-18 DIAGNOSIS — R9431 Abnormal electrocardiogram [ECG] [EKG]: Secondary | ICD-10-CM | POA: Insufficient documentation

## 2022-07-09 DIAGNOSIS — Z85828 Personal history of other malignant neoplasm of skin: Secondary | ICD-10-CM | POA: Diagnosis not present

## 2022-07-09 DIAGNOSIS — Z08 Encounter for follow-up examination after completed treatment for malignant neoplasm: Secondary | ICD-10-CM | POA: Diagnosis not present

## 2022-10-29 ENCOUNTER — Telehealth: Payer: Self-pay | Admitting: Radiology

## 2022-10-29 NOTE — Telephone Encounter (Signed)
Contacted Jeffrey Rose to schedule their annual wellness visit. Patient declined to schedule AWV at this time.  Graylon Good CMA

## 2022-12-04 IMAGING — CT CT MAXILLOFACIAL W/O CM
3 of 4 series · 14 of 47 positions shown, 16 images · non-contrast
Comparison: None Available.

CLINICAL DATA: freq nosebleeds



[Series 3: ax soft (person_name) · axial · 0.34mm/px · z∈[+1118,+1222]mm · 8 of 62 slices shown, 10 images]
[im 5/62  brain]
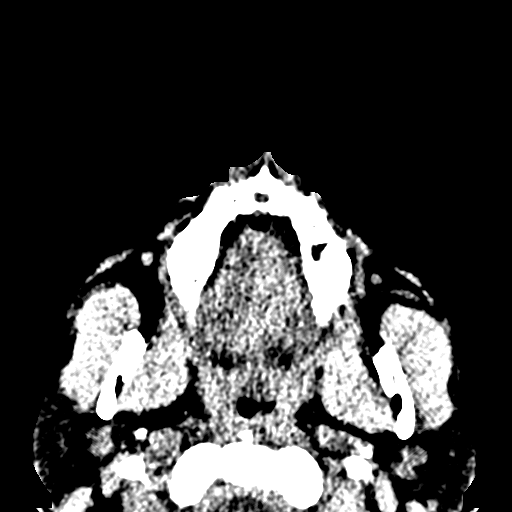
[im 5/62  bone]
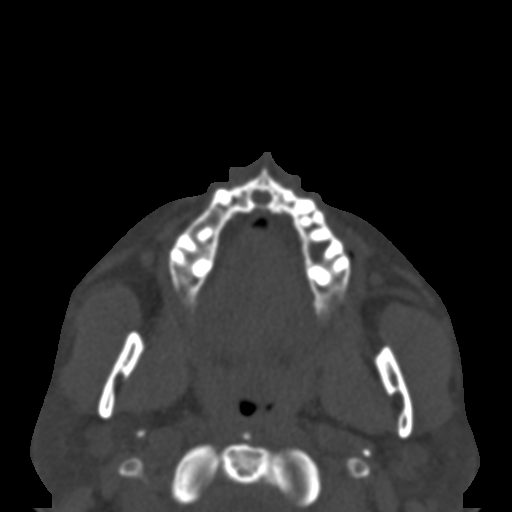
[im 14/62  bone]
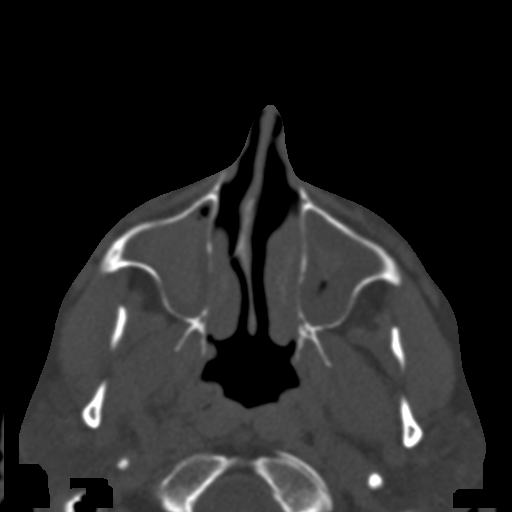
[im 22/62  bone]
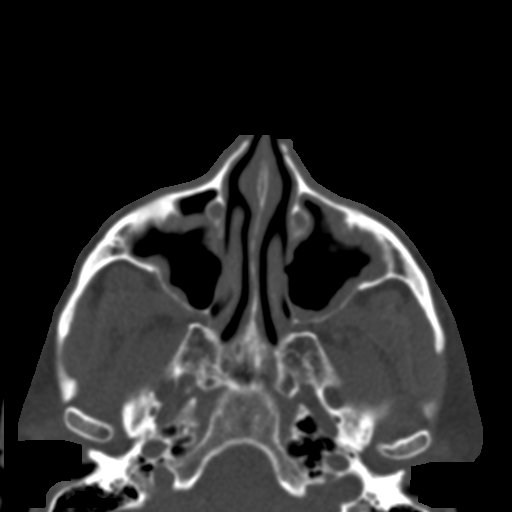
[im 27/62  bone]
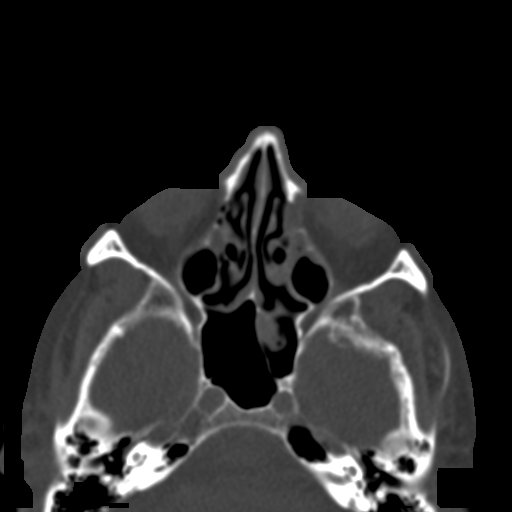
[im 35/62  brain]
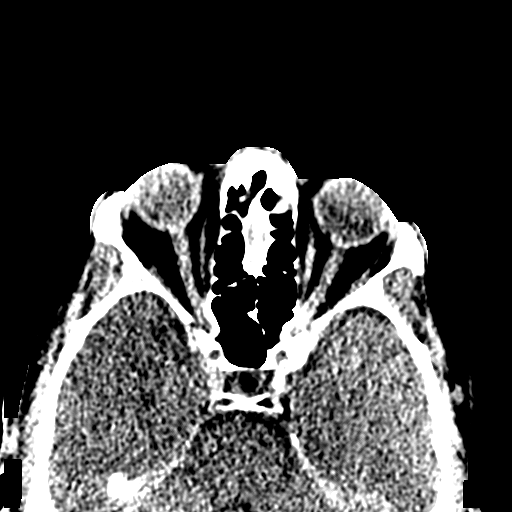
[im 35/62  bone]
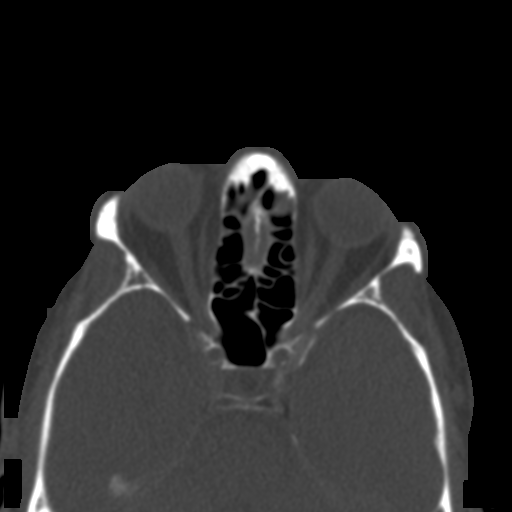
[im 40/62  bone]
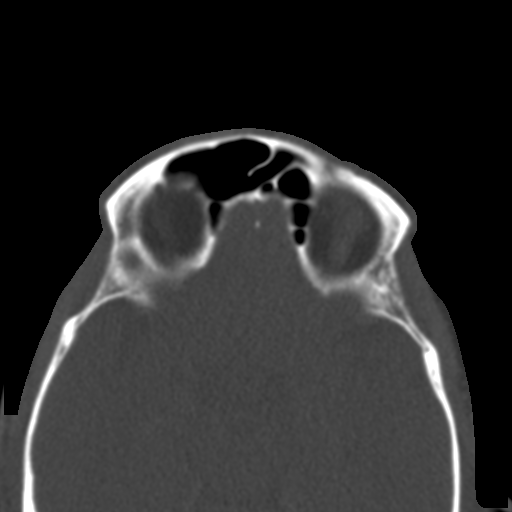
[im 48/62  bone]
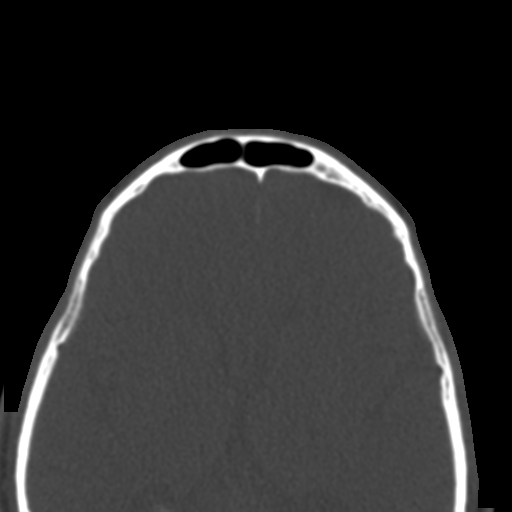
[im 57/62  bone]
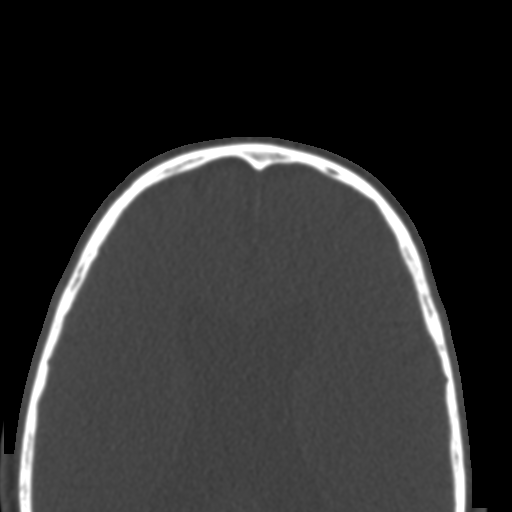

[Series 5: coronal bone · coronal · 0.25mm/px · 3 of 78 slices shown]
[im 26/78  bone]
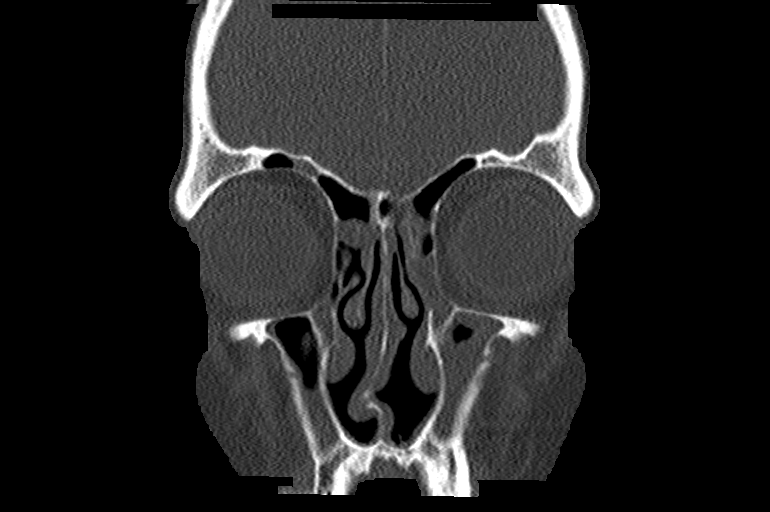
[im 35/78  bone]
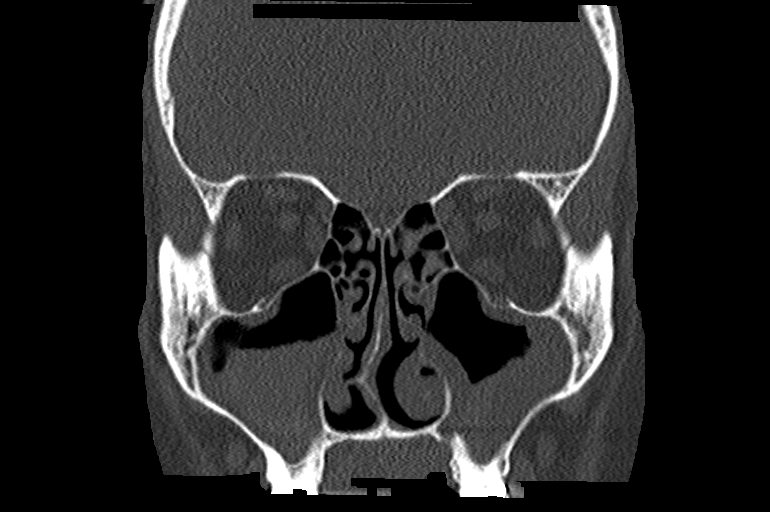
[im 43/78  bone]
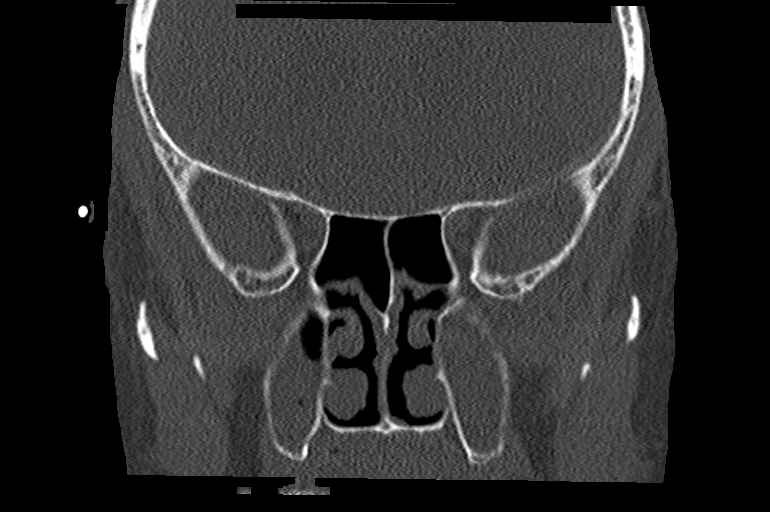

[Series 6: sagittal bone · sagittal · 0.25mm/px · 3 of 96 slices shown]
[im 32/96  bone]
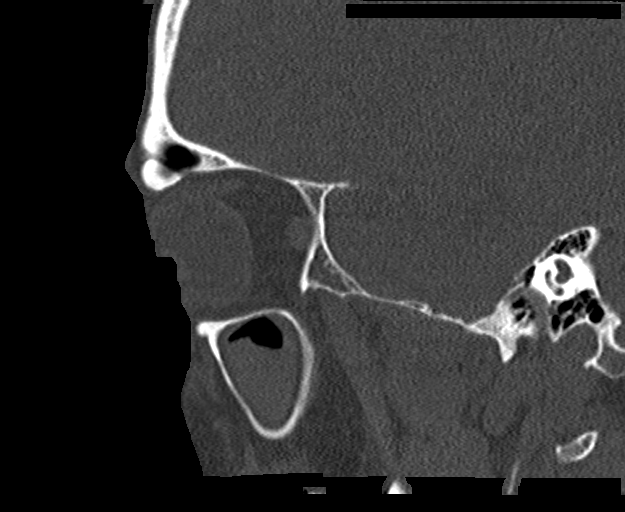
[im 48/96  bone]
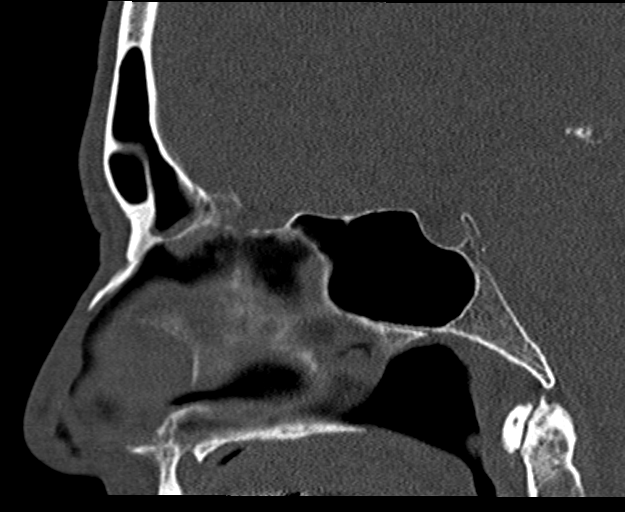
[im 64/96  bone]
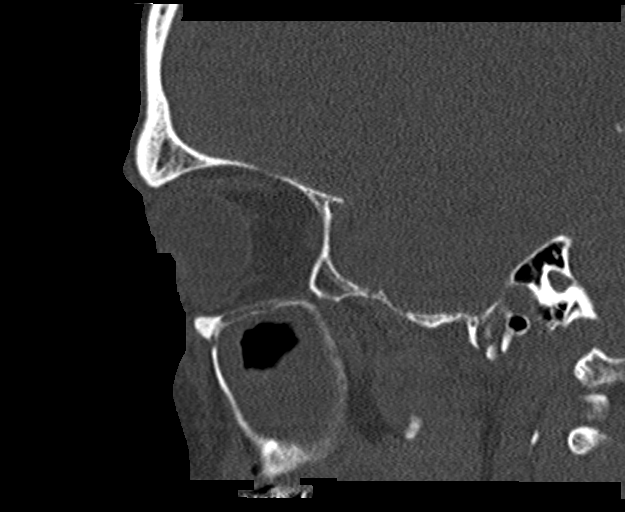

[14 of 47 positions shown; findings below may reference images not displayed]

FINDINGS: Osseous: No fracture or mandibular dislocation. No destructive
process.

Orbits: Negative. No traumatic or inflammatory finding.

Sinuses: Mucosal thickening of scattered ethmoid air cells. Small
amount of frothy secretions in the left sphenoid sinus. Moderate
mucosal thickening of bilateral maxillary sinuses. Rightward nasal
septal deviation with bony spur.

Soft tissues: Negative.

Limited intracranial: No significant or unexpected finding.
IMPRESSION: Moderate paranasal sinus disease, predominantly involving the
ethmoid air cells, maxillary sinuses and left sphenoid sinus.

## 2023-04-23 ENCOUNTER — Encounter: Payer: Medicare Other | Admitting: Internal Medicine

## 2023-05-28 ENCOUNTER — Other Ambulatory Visit: Payer: Self-pay

## 2023-05-28 ENCOUNTER — Other Ambulatory Visit: Payer: Self-pay | Admitting: Internal Medicine

## 2023-06-04 ENCOUNTER — Encounter: Payer: Medicare Other | Admitting: Internal Medicine

## 2023-06-11 ENCOUNTER — Ambulatory Visit (INDEPENDENT_AMBULATORY_CARE_PROVIDER_SITE_OTHER): Payer: Medicare Other | Admitting: Internal Medicine

## 2023-06-11 ENCOUNTER — Encounter: Payer: Self-pay | Admitting: Internal Medicine

## 2023-06-11 VITALS — BP 136/82 | HR 70 | Temp 98.9°F | Ht 70.0 in | Wt 193.0 lb

## 2023-06-11 DIAGNOSIS — E782 Mixed hyperlipidemia: Secondary | ICD-10-CM | POA: Diagnosis not present

## 2023-06-11 DIAGNOSIS — R739 Hyperglycemia, unspecified: Secondary | ICD-10-CM

## 2023-06-11 DIAGNOSIS — I10 Essential (primary) hypertension: Secondary | ICD-10-CM | POA: Diagnosis not present

## 2023-06-11 DIAGNOSIS — Z0000 Encounter for general adult medical examination without abnormal findings: Secondary | ICD-10-CM

## 2023-06-11 DIAGNOSIS — E559 Vitamin D deficiency, unspecified: Secondary | ICD-10-CM

## 2023-06-11 DIAGNOSIS — Z125 Encounter for screening for malignant neoplasm of prostate: Secondary | ICD-10-CM

## 2023-06-11 DIAGNOSIS — E538 Deficiency of other specified B group vitamins: Secondary | ICD-10-CM | POA: Diagnosis not present

## 2023-06-11 LAB — HEPATIC FUNCTION PANEL
ALT: 46 U/L (ref 0–53)
AST: 42 U/L — ABNORMAL HIGH (ref 0–37)
Albumin: 4.4 g/dL (ref 3.5–5.2)
Alkaline Phosphatase: 61 U/L (ref 39–117)
Bilirubin, Direct: 0.2 mg/dL (ref 0.0–0.3)
Total Bilirubin: 1.3 mg/dL — ABNORMAL HIGH (ref 0.2–1.2)
Total Protein: 7.2 g/dL (ref 6.0–8.3)

## 2023-06-11 LAB — BASIC METABOLIC PANEL
BUN: 18 mg/dL (ref 6–23)
CO2: 30 meq/L (ref 19–32)
Calcium: 9.2 mg/dL (ref 8.4–10.5)
Chloride: 102 meq/L (ref 96–112)
Creatinine, Ser: 0.92 mg/dL (ref 0.40–1.50)
GFR: 83.29 mL/min (ref >60.00)
Glucose, Bld: 99 mg/dL (ref 70–99)
Potassium: 4.3 meq/L (ref 3.5–5.1)
Sodium: 140 meq/L (ref 135–145)

## 2023-06-11 LAB — PSA: PSA: 0.82 ng/mL (ref 0.10–4.00)

## 2023-06-11 LAB — LIPID PANEL
Cholesterol: 183 mg/dL (ref 0–200)
HDL: 57.7 mg/dL (ref >39.00)
LDL Cholesterol: 51 mg/dL (ref 0–99)
NonHDL: 124.99
Total CHOL/HDL Ratio: 3
Triglycerides: 368 mg/dL — ABNORMAL HIGH (ref 0.0–149.0)
VLDL: 73.6 mg/dL — ABNORMAL HIGH (ref 0.0–40.0)

## 2023-06-11 LAB — CBC WITH DIFFERENTIAL/PLATELET
Basophils Absolute: 0 10*3/uL (ref 0.0–0.1)
Basophils Relative: 0.8 % (ref 0.0–3.0)
Eosinophils Absolute: 0.3 10*3/uL (ref 0.0–0.7)
Eosinophils Relative: 6.2 % — ABNORMAL HIGH (ref 0.0–5.0)
HCT: 46.2 % (ref 39.0–52.0)
Hemoglobin: 15.5 g/dL (ref 13.0–17.0)
Lymphocytes Relative: 35.4 % (ref 12.0–46.0)
Lymphs Abs: 1.7 10*3/uL (ref 0.7–4.0)
MCHC: 33.6 g/dL (ref 30.0–36.0)
MCV: 94 fL (ref 78.0–100.0)
Monocytes Absolute: 0.6 10*3/uL (ref 0.1–1.0)
Monocytes Relative: 13.3 % — ABNORMAL HIGH (ref 3.0–12.0)
Neutro Abs: 2.1 10*3/uL (ref 1.4–7.7)
Neutrophils Relative %: 44.3 % (ref 43.0–77.0)
Platelets: 196 10*3/uL (ref 150.0–400.0)
RBC: 4.91 Mil/uL (ref 4.22–5.81)
RDW: 12.5 % (ref 11.5–15.5)
WBC: 4.7 10*3/uL (ref 4.0–10.5)

## 2023-06-11 LAB — TSH: TSH: 1.69 u[IU]/mL (ref 0.35–5.50)

## 2023-06-11 LAB — VITAMIN D 25 HYDROXY (VIT D DEFICIENCY, FRACTURES): VITD: 40.8 ng/mL (ref 30.00–100.00)

## 2023-06-11 LAB — HEMOGLOBIN A1C: Hgb A1c MFr Bld: 6.5 % (ref 4.6–6.5)

## 2023-06-11 LAB — VITAMIN B12: Vitamin B-12: 538 pg/mL (ref 211–911)

## 2023-06-11 NOTE — Patient Instructions (Addendum)
 Please continue all other medications as before, and refills have been done if requested.  Please have the pharmacy call with any other refills you may need.  Please continue your efforts at being more active, low cholesterol diet, and weight control.  You are otherwise up to date with prevention measures today.  Please keep your appointments with your specialists as you may have planned  Please return to the lab in 3 months to recheck the cholesterol only, and if the LDL is not less than 70, you may wish to start Repatha shots in addition to the crestor  Please make an Appointment to return for your 1 year visit, or sooner if needed, with Lab testing by Appointment as well, to be done about 3-5 days before at the [facility] (so this is for TWO appointments - please see the scheduling desk as you leave)

## 2023-06-11 NOTE — Progress Notes (Signed)
 Patient ID: CLEARENCE VITUG, male   DOB: 15-Feb-1951, 73 y.o.   MRN: [id number]         Chief Complaint:: wellness exam and htn, hyperglycemia, hld, low vit d       HPI:  Jeffrey Rose is a 73 y.o. male here for wellness exam; declines covid booster, flu shot, o/w up to date                        Also Pt denies chest pain, increased sob or doe, wheezing, orthopnea, PND, increased LE swelling, palpitations, dizziness or syncope.   Pt denies polydipsia, polyuria, or new focal neuro s/s.    Pt denies fever, wt loss, night sweats, loss of appetite, or other constitutional symptoms  Admits to less than good low chol diet.    Wt Readings from Last 3 Encounters:  06/11/23 193 lb (87.5 kg)  05/30/22 191 lb (86.6 kg)  09/14/21 188 lb (85.3 kg)   BP Readings from Last 3 Encounters:  06/11/23 136/82  05/30/22 130/76  09/14/21 126/80   Immunization History  Administered Date(s) Administered   Influenza Split 01/27/2013   Influenza, High Dose Seasonal PF 02/24/2018   Influenza-Unspecified 02/13/2017, 02/27/2018, 02/06/2020, 02/11/2021, 01/28/2022   Moderna SARS-COV2 Booster Vaccination 02/06/2020   Moderna Sars-Covid-2 Vaccination 05/20/2019, 06/15/2019   PFIZER(Purple Top)SARS-COV-2 Vaccination 11/16/2020   Pneumococcal Conjugate-13 05/05/2019   Pneumococcal Polysaccharide-23 04/25/2017   Tdap 03/27/2016   Zoster Recombinant(Shingrix) 02/09/2021, 05/18/2021   Health Maintenance Due  Topic Date Due   Medicare Annual Wellness (AWV)  03/27/2017      Past Medical History:  Diagnosis Date   Arthritis    Blood transfusion without reported diagnosis    Hyperlipidemia    Hypertension    Urinary tract infection    Past Surgical History:  Procedure Laterality Date   BUNIONECTOMY Bilateral    COLONOSCOPY     oral growth removed     oral growth removed from mouth.    reports that he has quit smoking. He has never used smokeless tobacco. He reports current alcohol use of about 2.0 - 3.0  standard drinks of alcohol per week. He reports that he does not use drugs. family history includes Diabetes in his mother; Emphysema in his father; Heart disease in his father; Kidney disease in his mother. No Known Allergies Current Outpatient Medications on File Prior to Visit  Medication Sig Dispense Refill   aspirin 81 MG tablet Take 1 tablet (81 mg total) by mouth daily. 90 tablet 3   Cholecalciferol (VITAMIN D-3 PO) Take 1 tablet by mouth daily.     Glucosamine HCl (GLUCOSAMINE PO) Take 1 tablet by mouth daily.     Multiple Vitamins-Minerals (MULTIVITAL PO) Take 1 tablet by mouth daily.     Propylene Glycol (SYSTANE BALANCE OP) Place 1 drop into both eyes daily as needed (dru eyes).     rosuvastatin (CRESTOR) 40 MG tablet TAKE 1 TABLET BY MOUTH DAILY 90 tablet 3   cetirizine (ZYRTEC ALLERGY) 10 MG tablet Take 1 tablet (10 mg total) by mouth daily. 30 tablet 0   No current facility-administered medications on file prior to visit.        ROS:  All others reviewed and negative.  Objective        PE:  BP 136/82 (BP Location: Right Arm, Patient Position: Sitting, Cuff Size: Normal)   Pulse 70   Temp 98.9 F (37.2 C) (Oral)   Ht 5'  10" (1.778 m)   Wt 193 lb (87.5 kg)   SpO2 98%   BMI 27.69 kg/m                 Constitutional: Pt appears in NAD               HENT: Head: NCAT.                Right Ear: External ear normal.                 Left Ear: External ear normal.                Eyes: . Pupils are equal, round, and reactive to light. Conjunctivae and EOM are normal               Nose: without d/c or deformity               Neck: Neck supple. Gross normal ROM               Cardiovascular: Normal rate and regular rhythm.                 Pulmonary/Chest: Effort normal and breath sounds without rales or wheezing.                Abd:  Soft, NT, ND, + BS, no organomegaly               Neurological: Pt is alert. At baseline orientation, motor grossly intact               Skin:  Skin is warm. No rashes, no other new lesions, LE edema - none               Psychiatric: Pt behavior is normal without agitation   Micro: none  Cardiac tracings I have personally interpreted today:  none  Pertinent Radiological findings (summarize): none   Lab Results  Component Value Date   WBC 4.7 06/11/2023   HGB 15.5 06/11/2023   HCT 46.2 06/11/2023   PLT 196.0 06/11/2023   GLUCOSE 99 06/11/2023   CHOL 183 06/11/2023   TRIG 368.0 (H) 06/11/2023   HDL 57.70 06/11/2023   LDLDIRECT 145.0 05/30/2022   LDLCALC 51 06/11/2023   ALT 46 06/11/2023   AST 42 (H) 06/11/2023   NA 140 06/11/2023   K 4.3 06/11/2023   CL 102 06/11/2023   CREATININE 0.92 06/11/2023   BUN 18 06/11/2023   CO2 30 06/11/2023   TSH 1.69 06/11/2023   PSA 0.82 06/11/2023   HGBA1C 6.5 06/11/2023   Assessment/Plan:  KAYLEM GIDNEY is a 73 y.o. White or Caucasian [1] male with  has a past medical history of Arthritis, Blood transfusion without reported diagnosis, Hyperlipidemia, Hypertension, and Urinary tract infection.  Preventative health care Age and sex appropriate education and counseling updated with regular exercise and diet Referrals for preventative services - none needed Immunizations addressed - none needed Smoking counseling  - none needed Evidence for depression or other mood disorder - none significant Most recent labs reviewed. I have personally reviewed and have noted: 1) the patient's medical and social history 2) The patient's current medications and supplements 3) The patient's height, weight, and BMI have been recorded in the chart   Essential hypertension BP Readings from Last 3 Encounters:  06/11/23 136/82  05/30/22 130/76  09/14/21 126/80   Stable, pt to continue medical treatment  - diet,wt control   Hyperglycemia Lab  Results  Component Value Date   HGBA1C 6.5 06/11/2023   Stable, pt to continue current medical treatment  - diet, wt control   Mixed  hyperlipidemia Lab Results  Component Value Date   LDLCALC 51 06/11/2023   Stable, pt to continue current statin crestor 40 qd  Vitamin D deficiency Last vitamin D Lab Results  Component Value Date   VD25OH 40.80 06/11/2023   Stable, cont oral replacement  Followup: Return in about 1 year (around 06/10/2024).  Oliver Barre, MD 06/14/2023 8:55 PM [facility]

## 2023-06-12 ENCOUNTER — Encounter: Payer: Self-pay | Admitting: Internal Medicine

## 2023-06-12 LAB — URINALYSIS, ROUTINE W REFLEX MICROSCOPIC
Bilirubin Urine: NEGATIVE
Hgb urine dipstick: NEGATIVE
Ketones, ur: NEGATIVE
Leukocytes,Ua: NEGATIVE
Nitrite: NEGATIVE
RBC / HPF: NONE SEEN
Specific Gravity, Urine: 1.02 (ref 1.000–1.030)
Total Protein, Urine: NEGATIVE
Urine Glucose: NEGATIVE
Urobilinogen, UA: 1 (ref 0.0–1.0)
pH: 7.5 (ref 5.0–8.0)

## 2023-06-12 NOTE — Progress Notes (Signed)
 The test results show that your current treatment is OK, as the tests are stable.  Please continue the same plan.  There is no other need for change of treatment or further evaluation based on these results, at this time.  thanks

## 2023-06-14 ENCOUNTER — Encounter: Payer: Self-pay | Admitting: Internal Medicine

## 2023-06-14 NOTE — Assessment & Plan Note (Signed)

## 2023-06-14 NOTE — Assessment & Plan Note (Signed)
 Lab Results  Component Value Date   HGBA1C 6.5 06/11/2023   Stable, pt to continue current medical treatment  - diet, wt control

## 2023-06-14 NOTE — Addendum Note (Signed)
 Addended by: Corwin Levins on: 06/14/2023 08:58 PM   Modules accepted: Orders

## 2023-06-14 NOTE — Assessment & Plan Note (Signed)
 BP Readings from Last 3 Encounters:  06/11/23 136/82  05/30/22 130/76  09/14/21 126/80   Stable, pt to continue medical treatment  - diet,wt control

## 2023-06-14 NOTE — Assessment & Plan Note (Signed)
 Lab Results  Component Value Date   LDLCALC 51 06/11/2023   Stable, pt to continue current statin crestor 40 qd

## 2023-06-14 NOTE — Assessment & Plan Note (Signed)
 Last vitamin D Lab Results  Component Value Date   VD25OH 40.80 06/11/2023   Stable, cont oral replacement

## 2023-10-13 ENCOUNTER — Ambulatory Visit (INDEPENDENT_AMBULATORY_CARE_PROVIDER_SITE_OTHER)

## 2023-10-13 VITALS — BP 140/80 | HR 69 | Ht 69.5 in | Wt 188.2 lb

## 2023-10-13 DIAGNOSIS — Z0000 Encounter for general adult medical examination without abnormal findings: Secondary | ICD-10-CM

## 2023-10-13 NOTE — Progress Notes (Signed)
 Subjective:   Jeffrey Rose is a 73 y.o. who presents for a Medicare Wellness preventive visit.  As a reminder, Annual Wellness Visits don't include a physical exam, and some assessments may be limited, especially if this visit is performed virtually. We may recommend an in-person follow-up visit with your provider if needed.  Visit Complete: In person  Persons Participating in Visit: Patient.  AWV Questionnaire: Yes: Patient Medicare AWV questionnaire was completed by the patient on 10/10/2023; I have confirmed that all information answered by patient is correct and no changes since this date.  Cardiac Risk Factors include: advanced age (>72men, >25 women);hypertension;male gender;dyslipidemia     Objective:    Today's Vitals   10/13/23 1326  Weight: 188 lb 3.2 oz (85.4 kg)  Height: 5' 9.5 (1.765 m)   Body mass index is 27.39 kg/m.     10/13/2023    1:30 PM 09/12/2021    2:37 AM 09/08/2021    7:07 AM 09/03/2021    4:46 AM 09/03/2021    4:45 AM 08/30/2021    2:31 PM 06/13/2016   12:39 PM  Advanced Directives  Does Patient Have a Medical Advance Directive? No No No No No No No   Would patient like information on creating a medical advance directive?    No - Patient declined No - Patient declined No - Patient declined      Data saved with a previous flowsheet row definition    Current Medications (verified) Outpatient Encounter Medications as of 10/13/2023  Medication Sig   aspirin  81 MG tablet Take 1 tablet (81 mg total) by mouth daily.   Cholecalciferol (VITAMIN D -3 PO) Take 1 tablet by mouth daily.   Multiple Vitamins-Minerals (MULTIVITAL PO) Take 1 tablet by mouth daily.   rosuvastatin  (CRESTOR ) 40 MG tablet TAKE 1 TABLET BY MOUTH DAILY   cetirizine  (ZYRTEC  ALLERGY) 10 MG tablet Take 1 tablet (10 mg total) by mouth daily.   Glucosamine HCl (GLUCOSAMINE PO) Take 1 tablet by mouth daily.   Propylene Glycol (SYSTANE BALANCE OP) Place 1 drop into both eyes daily as needed  (dru eyes).   No facility-administered encounter medications on file as of 10/13/2023.    Allergies (verified) Patient has no known allergies.   History: Past Medical History:  Diagnosis Date   Arthritis    Blood transfusion without reported diagnosis    Hyperlipidemia    Hypertension    Urinary tract infection    Past Surgical History:  Procedure Laterality Date   BUNIONECTOMY Bilateral    COLONOSCOPY     oral growth removed     oral growth removed from mouth.   Family History  Problem Relation Age of Onset   Kidney disease Mother    Diabetes Mother    Emphysema Father    Heart disease Father    Colon cancer Neg Hx    Esophageal cancer Neg Hx    Rectal cancer Neg Hx    Stomach cancer Neg Hx    Colon polyps Neg Hx    Social History   Socioeconomic History   Marital status: Married    Spouse name: Devere   Number of children: 2   Years of education: 16   Highest education level: Bachelor's degree (e.g., BA, AB, BS)  Occupational History   Occupation: Public librarian  Tobacco Use   Smoking status: Former   Smokeless tobacco: Never   Tobacco comments:    smoke pipe, quit 20 years ago  Vaping Use  Vaping status: Never Used  Substance and Sexual Activity   Alcohol use: Yes    Alcohol/week: 2.0 - 3.0 standard drinks of alcohol    Types: 2 - 3 Glasses of wine per week    Comment: occasionally    Drug use: No   Sexual activity: Not on file  Other Topics Concern   Not on file  Social History Narrative   Fun: [employer]         Lives with wife and 2 dogs/2025   Social Drivers of Health   Financial Resource Strain: Low Risk  (10/10/2023)   Overall Financial Resource Strain (CARDIA)    Difficulty of Paying Living Expenses: Not hard at all  Food Insecurity: No Food Insecurity (10/10/2023)   Hunger Vital Sign    Worried About Running Out of Food in the Last Year: Never true    Ran Out of Food in the Last Year: Never true  Transportation Needs: No  Transportation Needs (10/10/2023)   PRAPARE - Administrator, Civil Service (Medical): No    Lack of Transportation (Non-Medical): No  Physical Activity: Sufficiently Active (10/10/2023)   Exercise Vital Sign    Days of Exercise per Week: 6 days    Minutes of Exercise per Session: 30 min  Stress: No Stress Concern Present (10/10/2023)   [employer] of Occupational Health - Occupational Stress Questionnaire    Feeling of Stress: Not at all  Social Connections: Moderately Isolated (10/10/2023)   Social Connection and Isolation Panel    Frequency of Communication with Friends and Family: Three times a week    Frequency of Social Gatherings with Friends and Family: Twice a week    Attends Religious Services: Never    Database administrator or Organizations: No    Attends Engineer, structural: Not on file    Marital Status: Married    Tobacco Counseling Counseling given: Not Answered Tobacco comments: smoke pipe, quit 20 years ago    Clinical Intake:  Pre-visit preparation completed: Yes  Pain : No/denies pain     BMI - recorded: 27.39 Nutritional Status: BMI 25 -29 Overweight Nutritional Risks: None  Lab Results  Component Value Date   HGBA1C 6.5 06/11/2023   HGBA1C 6.3 05/30/2022   HGBA1C 6.3 05/09/2021     How often do you need to have someone help you when you read instructions, pamphlets, or other written materials from your doctor or pharmacy?: 1 - Never  Interpreter Needed?: No  Information entered by :: Neysha Criado, RMA   Activities of Daily Living     10/10/2023    2:23 PM  In your present state of health, do you have any difficulty performing the following activities:  Hearing? 0  Vision? 0  Difficulty concentrating or making decisions? 0  Walking or climbing stairs? 0  Dressing or bathing? 0  Doing errands, shopping? 0  Preparing Food and eating ? N  Using the Toilet? N  In the past six months, have you accidently leaked  urine? N  Do you have problems with loss of bowel control? N  Managing your Medications? N  Managing your Finances? N  Housekeeping or managing your Housekeeping? N    Patient Care Team: Norleen Lynwood ORN, MD as PCP - General (Internal Medicine) [facility], [place]  I have updated your Care Teams any recent Medical Services you may have received from other providers in the past year.     Assessment:  This is a routine wellness examination for [facility].  Hearing/Vision screen Hearing Screening - Comments:: Denies hearing difficulties   Vision Screening - Comments:: Denies vision issues./ Dr. [place]    Goals Addressed             This Visit's Progress    Patient Stated       Stay healthy and active       Depression Screen     10/13/2023    1:31 PM 06/11/2023    1:30 PM 05/30/2022    8:10 AM 09/14/2021   10:02 AM 05/16/2021    2:14 PM 05/16/2021    1:47 PM 05/10/2020    2:22 PM  PHQ 2/9 Scores  PHQ - 2 Score 0 0 0 0 0 0 0  PHQ- 9 Score 1  0 0       Fall Risk     10/10/2023    2:23 PM 06/11/2023    1:41 PM 05/30/2022    8:10 AM 09/14/2021   10:02 AM 05/16/2021    2:14 PM  Fall Risk   Falls in the past year? 0 0 0 0 0  Number falls in past yr: 0 0  0 0  Injury with Fall? 0 0 0 0 0  Risk for fall due to :  No Fall Risks No Fall Risks    Follow up  Falls evaluation completed Falls evaluation completed      MEDICARE RISK AT HOME:  Medicare Risk at Home Any stairs in or around the home?: (Patient-Rptd) Yes If so, are there any without handrails?: (Patient-Rptd) No Home free of loose throw rugs in walkways, pet beds, electrical cords, etc?: (Patient-Rptd) No Adequate lighting in your home to reduce risk of falls?: (Patient-Rptd) Yes Life alert?: (Patient-Rptd) No Use of a cane, walker or w/c?: (Patient-Rptd) No Grab bars in the bathroom?: (Patient-Rptd) No Shower chair or bench in shower?: (Patient-Rptd) No Elevated toilet seat or a handicapped toilet?:  (Patient-Rptd) No  TIMED UP AND GO:  Was the test performed?  Yes  Length of time to ambulate 10 feet: 10 sec Gait steady and fast without use of assistive device  Cognitive Function: Declined/Normal: No cognitive concerns noted by patient or family. Patient alert, oriented, able to answer questions appropriately and recall recent events. No signs of memory loss or confusion.        Immunizations Immunization History  Administered Date(s) Administered   Influenza Split 01/27/2013   Influenza, High Dose Seasonal PF 02/24/2018   Influenza-Unspecified 02/13/2017, 02/27/2018, 02/06/2020, 02/11/2021, 01/28/2022   Moderna SARS-COV2 Booster Vaccination 02/06/2020   Moderna Sars-Covid-2 Vaccination 05/20/2019, 06/15/2019   PFIZER(Purple Top)SARS-COV-2 Vaccination 11/16/2020   Pneumococcal Conjugate-13 05/05/2019   Pneumococcal Polysaccharide-23 04/25/2017   Tdap 03/27/2016   Zoster Recombinant(Shingrix) 02/09/2021, 05/18/2021    Screening Tests Health Maintenance  Topic Date Due   Medicare Annual Wellness (AWV)  03/27/2017   COVID-19 Vaccine (5 - 2024-25 season) 12/15/2022   INFLUENZA VACCINE  11/14/2023   DTaP/Tdap/Td (2 - Td or Tdap) 03/27/2026   Pneumococcal Vaccine: 50+ Years  Completed   Hepatitis C Screening  Completed   Zoster Vaccines- Shingrix  Completed   Hepatitis B Vaccines  Aged Out   HPV VACCINES  Aged Out   Meningococcal B Vaccine  Aged Out   Colonoscopy  Discontinued    Health Maintenance  Health Maintenance Due  Topic Date Due   Medicare Annual Wellness (AWV)  03/27/2017   COVID-19 Vaccine (5 - 2024-25 season) 12/15/2022  Health Maintenance Items Addressed: See Nurse Notes at the end of this note  Additional Screening:  Vision Screening: Recommended annual ophthalmology exams for early detection of glaucoma and other disorders of the eye. Would you like a referral to an eye doctor? No    Dental Screening: Recommended annual dental exams for proper  oral hygiene  Community Resource Referral / Chronic Care Management: CRR required this visit?  No   CCM required this visit?  No   Plan:    I have personally reviewed and noted the following in the patient's chart:   Medical and social history Use of alcohol, tobacco or illicit drugs  Current medications and supplements including opioid prescriptions. Patient is not currently taking opioid prescriptions. Functional ability and status Nutritional status Physical activity Advanced directives List of other physicians Hospitalizations, surgeries, and ER visits in previous 12 months Vitals Screenings to include cognitive, depression, and falls Referrals and appointments  In addition, I have reviewed and discussed with patient certain preventive protocols, quality metrics, and best practice recommendations. A written personalized care plan for preventive services as well as general preventive health recommendations were provided to patient.   Sorin Frimpong L Jerelyn Trimarco, CMA   10/13/2023   After Visit Summary: (MyChart) Due to this being a telephonic visit, the after visit summary with patients personalized plan was offered to patient via MyChart   Notes: Nothing significant to report at this time.

## 2023-10-13 NOTE — Patient Instructions (Signed)
 Jeffrey Rose , Thank you for taking time out of your busy schedule to complete your Annual Wellness Visit with me. I enjoyed our conversation and look forward to speaking with you again next year. I, as well as your care team,  appreciate your ongoing commitment to your health goals. Please review the following plan we discussed and let me know if I can assist you in the future. Your Game plan/ To Do List    Follow up Visits: Next Medicare AWV with our clinical staff: 10/13/2024.   Have you seen your provider in the last 6 months (3 months if uncontrolled diabetes)? Yes Next Office Visit with your provider: 06/16/2024.  Clinician Recommendations:  Aim for 30 minutes of exercise or brisk walking, 6-8 glasses of water, and 5 servings of fruits and vegetables each day. Keep up the good work.      This is a list of the screening recommended for you and due dates:  Health Maintenance  Topic Date Due   Medicare Annual Wellness Visit  03/27/2017   COVID-19 Vaccine (5 - 2024-25 season) 12/15/2022   Flu Shot  11/14/2023   DTaP/Tdap/Td vaccine (2 - Td or Tdap) 03/27/2026   Pneumococcal Vaccine for age over 10  Completed   Hepatitis C Screening  Completed   Zoster (Shingles) Vaccine  Completed   Hepatitis B Vaccine  Aged Out   HPV Vaccine  Aged Out   Meningitis B Vaccine  Aged Out   Colon Cancer Screening  Discontinued    Advanced directives: (Copy Requested) Please bring a copy of your health care power of attorney and living will to the office to be added to your chart at your convenience. You can mail to [facility] [address] [place], [place] [address] or email to [email or web address] Advance Care Planning is important because it:  [x]  Makes sure you receive the medical care that is consistent with your values, goals, and preferences  [x]  It provides guidance to your family and loved ones and reduces their decisional burden about whether or not they are making the  right decisions based on your wishes.  Follow the link provided in your after visit summary or read over the paperwork we have mailed to you to help you started getting your Advance Directives in place. If you need assistance in completing these, please reach out to us  so that we can help you!  See attachments for Preventive Care and Fall Prevention Tips.

## 2023-10-31 ENCOUNTER — Other Ambulatory Visit

## 2023-10-31 ENCOUNTER — Ambulatory Visit: Payer: Self-pay | Admitting: Internal Medicine

## 2023-10-31 DIAGNOSIS — Z125 Encounter for screening for malignant neoplasm of prostate: Secondary | ICD-10-CM

## 2023-10-31 DIAGNOSIS — E559 Vitamin D deficiency, unspecified: Secondary | ICD-10-CM | POA: Diagnosis not present

## 2023-10-31 DIAGNOSIS — E782 Mixed hyperlipidemia: Secondary | ICD-10-CM | POA: Diagnosis not present

## 2023-10-31 DIAGNOSIS — E538 Deficiency of other specified B group vitamins: Secondary | ICD-10-CM | POA: Diagnosis not present

## 2023-10-31 DIAGNOSIS — R739 Hyperglycemia, unspecified: Secondary | ICD-10-CM

## 2023-10-31 LAB — HEPATIC FUNCTION PANEL
ALT: 34 U/L (ref 0–53)
AST: 28 U/L (ref 0–37)
Albumin: 4.4 g/dL (ref 3.5–5.2)
Alkaline Phosphatase: 54 U/L (ref 39–117)
Bilirubin, Direct: 0.2 mg/dL (ref 0.0–0.3)
Total Bilirubin: 1.4 mg/dL — ABNORMAL HIGH (ref 0.2–1.2)
Total Protein: 7 g/dL (ref 6.0–8.3)

## 2023-10-31 LAB — CBC WITH DIFFERENTIAL/PLATELET
Basophils Absolute: 0 K/uL (ref 0.0–0.1)
Basophils Relative: 0.6 % (ref 0.0–3.0)
Eosinophils Absolute: 0.2 K/uL (ref 0.0–0.7)
Eosinophils Relative: 3.3 % (ref 0.0–5.0)
HCT: 45.8 % (ref 39.0–52.0)
Hemoglobin: 15.3 g/dL (ref 13.0–17.0)
Lymphocytes Relative: 40 % (ref 12.0–46.0)
Lymphs Abs: 1.9 K/uL (ref 0.7–4.0)
MCHC: 33.4 g/dL (ref 30.0–36.0)
MCV: 92.6 fl (ref 78.0–100.0)
Monocytes Absolute: 0.6 K/uL (ref 0.1–1.0)
Monocytes Relative: 11.9 % (ref 3.0–12.0)
Neutro Abs: 2.1 K/uL (ref 1.4–7.7)
Neutrophils Relative %: 44.2 % (ref 43.0–77.0)
Platelets: 190 K/uL (ref 150.0–400.0)
RBC: 4.95 Mil/uL (ref 4.22–5.81)
RDW: 12.4 % (ref 11.5–15.5)
WBC: 4.8 K/uL (ref 4.0–10.5)

## 2023-10-31 LAB — URINALYSIS, ROUTINE W REFLEX MICROSCOPIC
Bilirubin Urine: NEGATIVE
Hgb urine dipstick: NEGATIVE
Ketones, ur: NEGATIVE
Nitrite: NEGATIVE
Specific Gravity, Urine: 1.02 (ref 1.000–1.030)
Total Protein, Urine: NEGATIVE
Urine Glucose: NEGATIVE
Urobilinogen, UA: 0.2 (ref 0.0–1.0)
pH: 6 (ref 5.0–8.0)

## 2023-10-31 LAB — BASIC METABOLIC PANEL WITH GFR
BUN: 16 mg/dL (ref 6–23)
CO2: 29 meq/L (ref 19–32)
Calcium: 9.5 mg/dL (ref 8.4–10.5)
Chloride: 101 meq/L (ref 96–112)
Creatinine, Ser: 0.89 mg/dL (ref 0.40–1.50)
GFR: 85.57 mL/min (ref >60.00)
Glucose, Bld: 129 mg/dL — ABNORMAL HIGH (ref 70–99)
Potassium: 4.3 meq/L (ref 3.5–5.1)
Sodium: 139 meq/L (ref 135–145)

## 2023-10-31 LAB — MICROALBUMIN / CREATININE URINE RATIO
Creatinine,U: 116.1 mg/dL
Microalb Creat Ratio: 30.1 mg/g — ABNORMAL HIGH (ref 0.0–30.0)
Microalb, Ur: 3.5 mg/dL — ABNORMAL HIGH (ref 0.0–1.9)

## 2023-10-31 LAB — LIPID PANEL
Cholesterol: 176 mg/dL (ref 0–200)
HDL: 54.7 mg/dL (ref >39.00)
LDL Cholesterol: 74 mg/dL (ref 0–99)
NonHDL: 121.71
Total CHOL/HDL Ratio: 3
Triglycerides: 237 mg/dL — ABNORMAL HIGH (ref 0.0–149.0)
VLDL: 47.4 mg/dL — ABNORMAL HIGH (ref 0.0–40.0)

## 2023-10-31 LAB — VITAMIN B12: Vitamin B-12: 500 pg/mL (ref 211–911)

## 2023-10-31 LAB — VITAMIN D 25 HYDROXY (VIT D DEFICIENCY, FRACTURES): VITD: 48.71 ng/mL (ref 30.00–100.00)

## 2023-10-31 LAB — HEMOGLOBIN A1C: Hgb A1c MFr Bld: 6.6 % — ABNORMAL HIGH (ref 4.6–6.5)

## 2023-10-31 LAB — TSH: TSH: 1.65 u[IU]/mL (ref 0.35–5.50)

## 2023-10-31 LAB — PSA: PSA: 1.15 ng/mL (ref 0.10–4.00)

## 2023-10-31 NOTE — Progress Notes (Signed)
 The test results show that your current treatment is OK, as the tests are stable.  Please continue the same plan.  There is no other need for change of treatment or further evaluation based on these results, at this time.  thanks

## 2023-11-04 DIAGNOSIS — D2372 Other benign neoplasm of skin of left lower limb, including hip: Secondary | ICD-10-CM | POA: Diagnosis not present

## 2023-11-04 DIAGNOSIS — B078 Other viral warts: Secondary | ICD-10-CM | POA: Diagnosis not present

## 2023-11-04 DIAGNOSIS — L820 Inflamed seborrheic keratosis: Secondary | ICD-10-CM | POA: Diagnosis not present

## 2024-05-05 ENCOUNTER — Encounter: Payer: Self-pay | Admitting: *Deleted

## 2024-05-05 NOTE — Progress Notes (Signed)
 Jeffrey Rose                                          MRN: [id number]   05/05/2024   The [facility] Quality Team Specialist reviewed this patient medical record for the purposes of chart review for care gap closure. The following were reviewed: abstraction for care gap closure-controlling blood pressure.    [facility]I Quality Team

## 2024-06-15 ENCOUNTER — Encounter: Admitting: Internal Medicine

## 2024-06-16 ENCOUNTER — Encounter: Payer: Medicare Other | Admitting: Internal Medicine

## 2024-10-13 ENCOUNTER — Ambulatory Visit
